# Patient Record
Sex: Male | Born: 1940 | Race: White | Hispanic: No | Marital: Married | Smoking: Never smoker
Health system: Southern US, Community
[De-identification: ages and names within clinical notes are randomized; demographics above are authoritative.]

## PROBLEM LIST (undated history)

## (undated) DIAGNOSIS — K219 Gastro-esophageal reflux disease without esophagitis: Secondary | ICD-10-CM

## (undated) DIAGNOSIS — M1990 Unspecified osteoarthritis, unspecified site: Secondary | ICD-10-CM

## (undated) DIAGNOSIS — G20 Parkinson's disease: Secondary | ICD-10-CM

## (undated) DIAGNOSIS — R030 Elevated blood-pressure reading, without diagnosis of hypertension: Secondary | ICD-10-CM

## (undated) DIAGNOSIS — Z8614 Personal history of Methicillin resistant Staphylococcus aureus infection: Secondary | ICD-10-CM

## (undated) DIAGNOSIS — G20A1 Parkinson's disease without dyskinesia, without mention of fluctuations: Secondary | ICD-10-CM

## (undated) DIAGNOSIS — Z872 Personal history of diseases of the skin and subcutaneous tissue: Secondary | ICD-10-CM

## (undated) DIAGNOSIS — R0609 Other forms of dyspnea: Secondary | ICD-10-CM

## (undated) HISTORY — DX: Parkinson's disease without dyskinesia, without mention of fluctuations: G20.A1

## (undated) HISTORY — DX: Personal history of diseases of the skin and subcutaneous tissue: Z87.2

## (undated) HISTORY — DX: Parkinson's disease: G20

## (undated) HISTORY — PX: COLONOSCOPY: SHX174

---

## 2006-12-26 ENCOUNTER — Ambulatory Visit: Payer: Self-pay | Admitting: Gastroenterology

## 2008-02-23 HISTORY — PX: ANKLE SURGERY: SHX546

## 2008-04-20 ENCOUNTER — Inpatient Hospital Stay: Payer: Self-pay | Admitting: Podiatry

## 2009-05-24 IMAGING — CR DG CHEST 1V PORT
1 series · 1 of 1 positions shown · non-contrast
Comparison: none

REASON FOR EXAM: pre-op
COMMENTS:

PROCEDURE:     DXR - DXR PORTABLE CHEST SINGLE VIEW  - April 20, 2008  [DATE]
RESULT:     The lungs are adequately inflated. There is no focal infiltrate.
The cardiac silhouette is top normal in size. There is mild prominence of
the central pulmonary vascularity. There is no focal pneumonia.

[view not recorded]
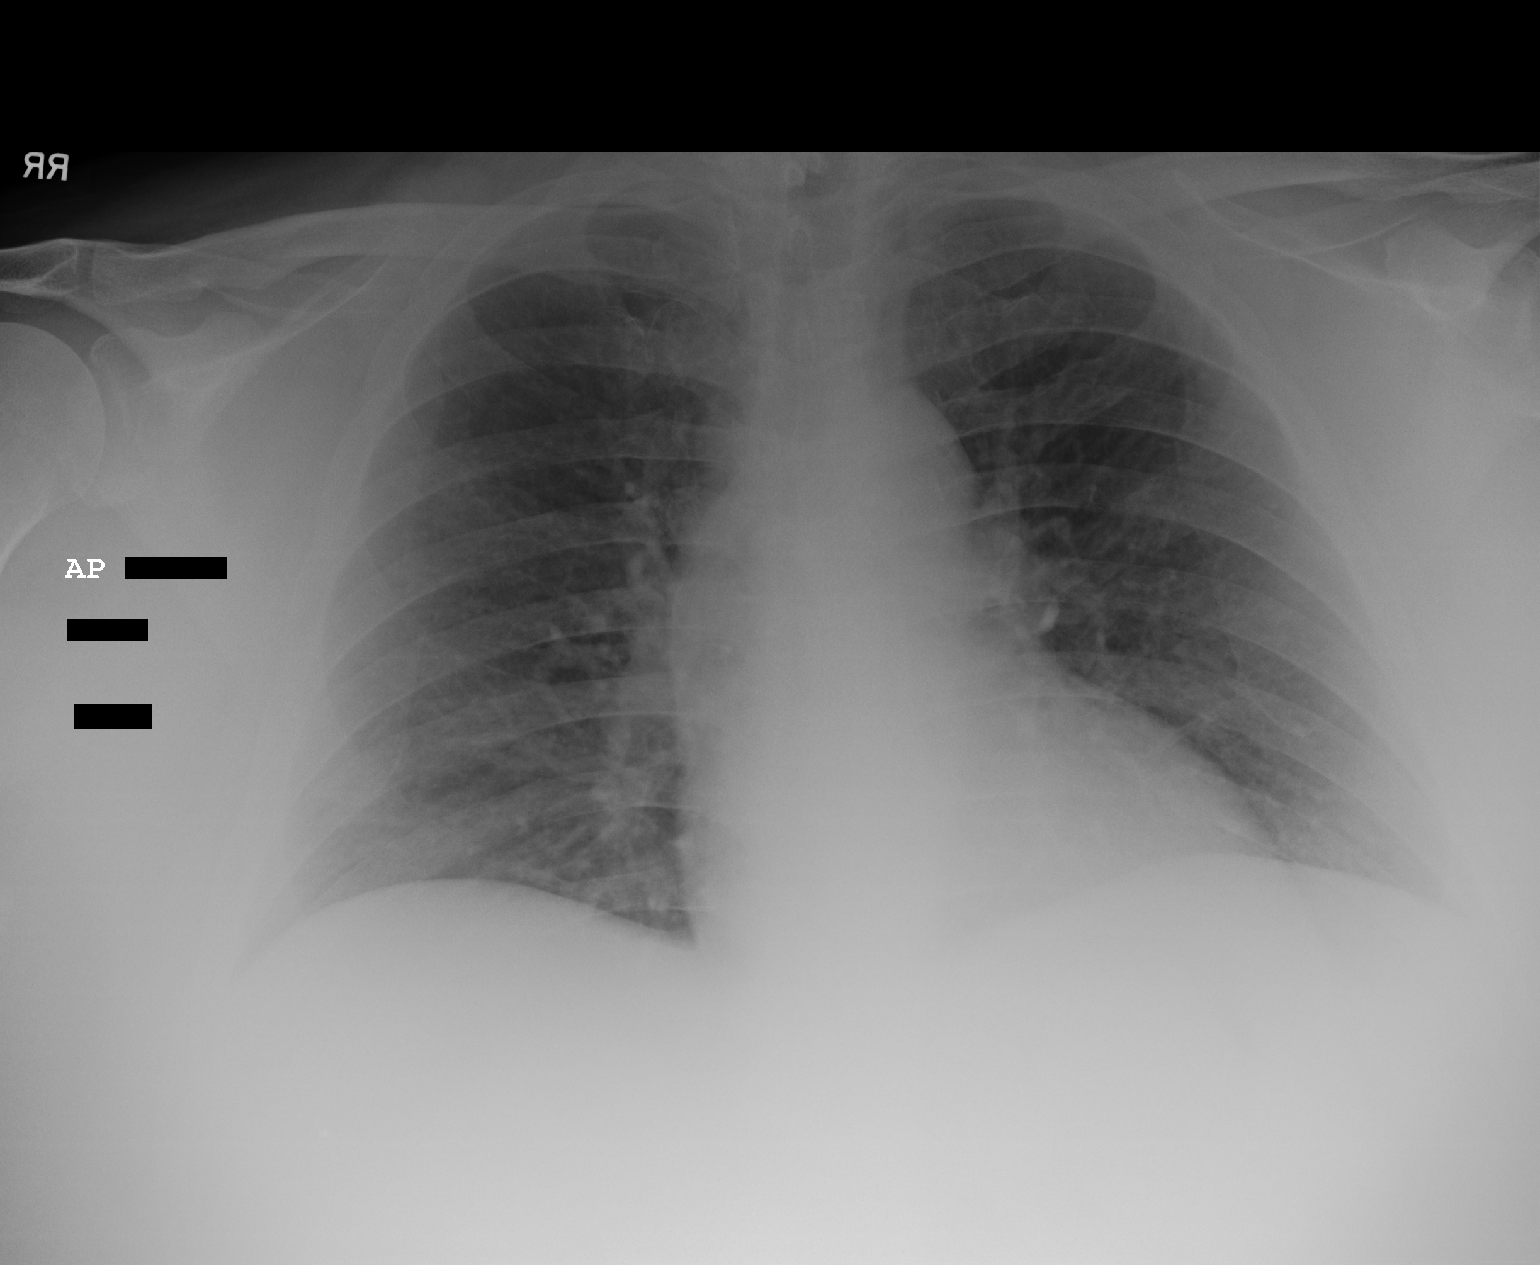

[1 of 1 positions shown; findings below may reference images not displayed]

IMPRESSION: Very mild prominence of the pulmonary vascularity is noted
which may be related more to the portable technique than to true underlying
CHF. Correlation with patient's clinical history is needed. I do not see
evidence of atelectasis or pneumonia.

## 2009-05-24 IMAGING — CR DG ANKLE COMPLETE 3+V*L*
1 series · 5 of 5 positions shown · non-contrast
Comparison: none

REASON FOR EXAM: ankle pain s/p fall
COMMENTS:

[Series 1: view not recorded · 0.17mm/px · 5 of 5 slices shown]
[im 1/5]
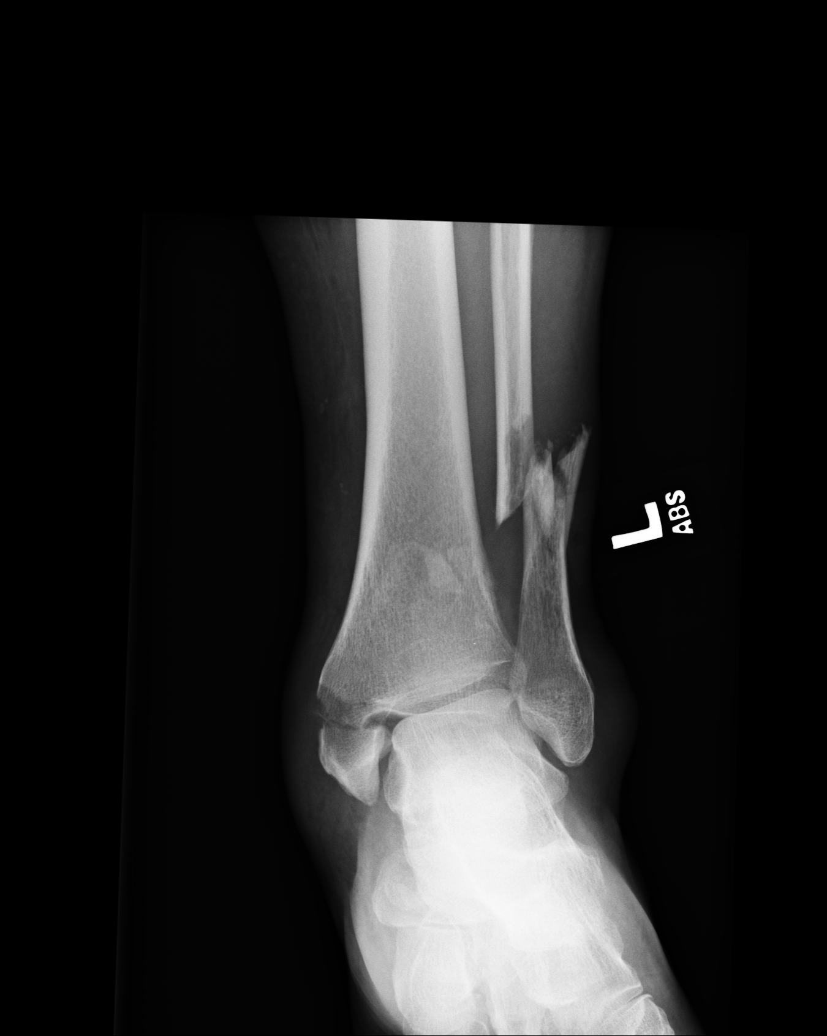
[im 2/5]
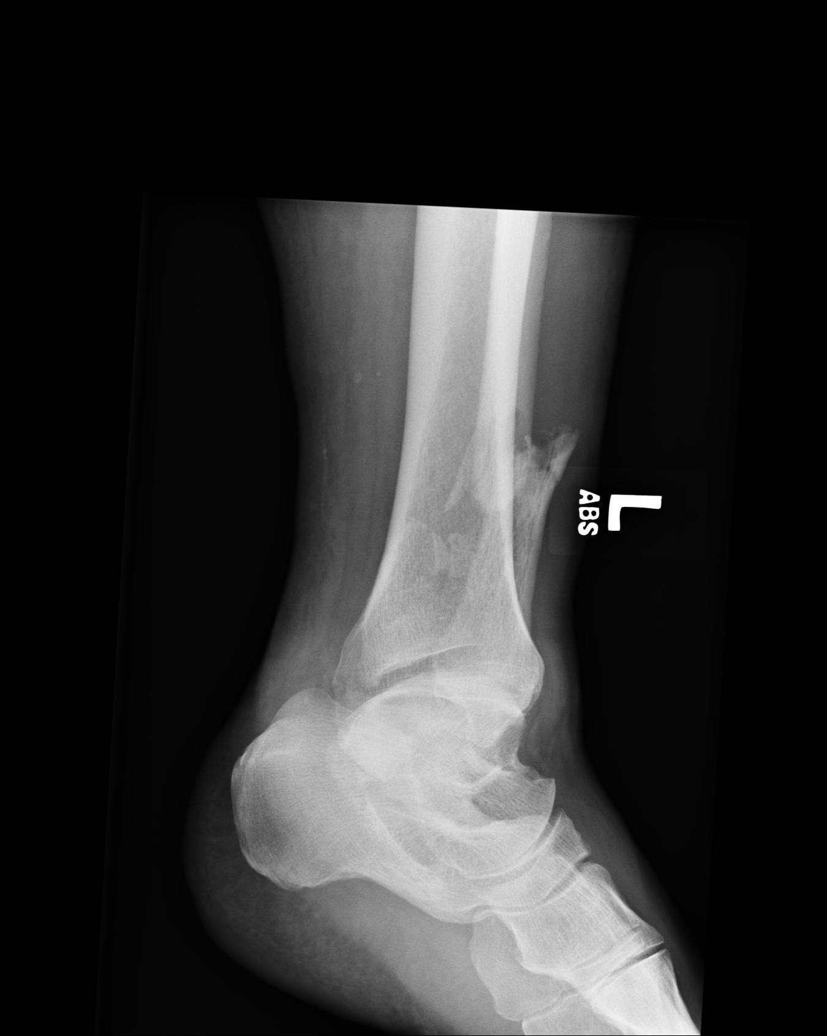
[im 3/5]
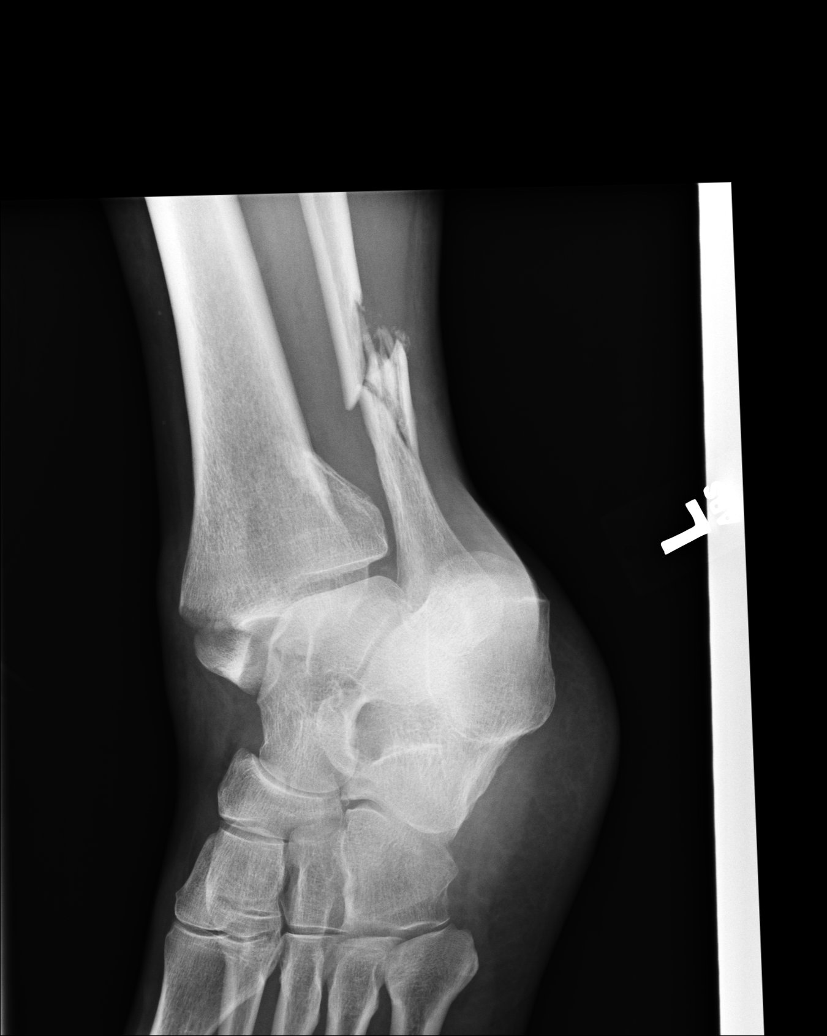
[im 4/5]
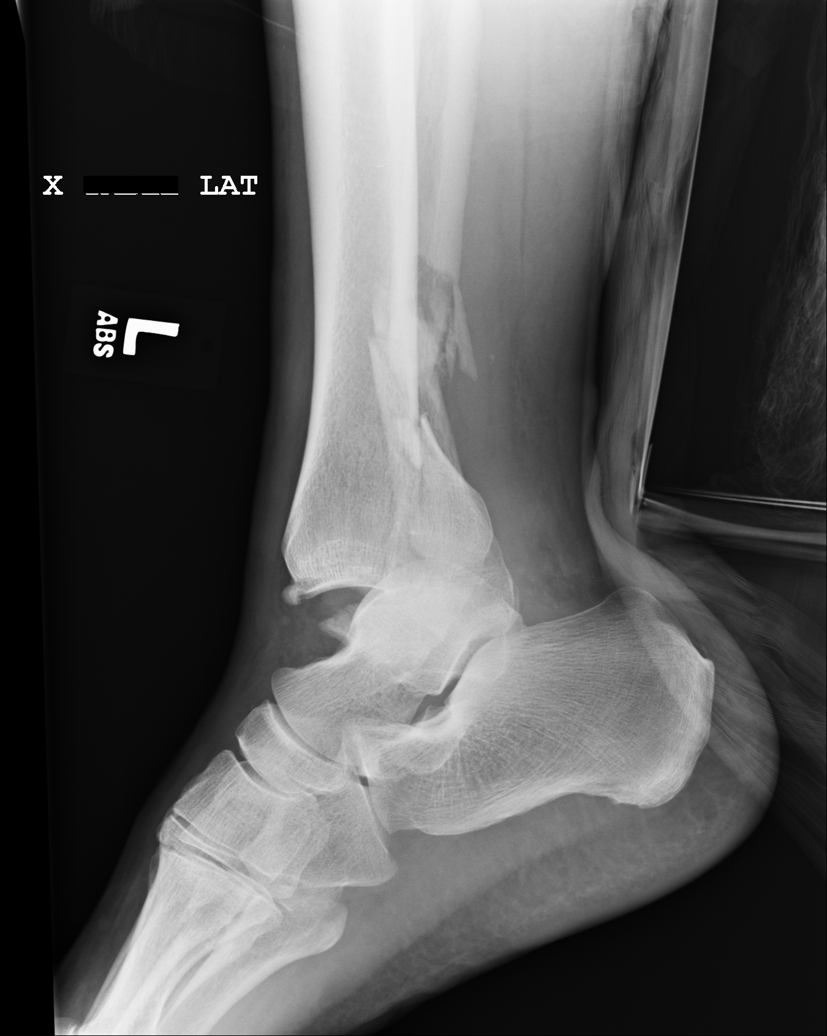
[im 5/5]
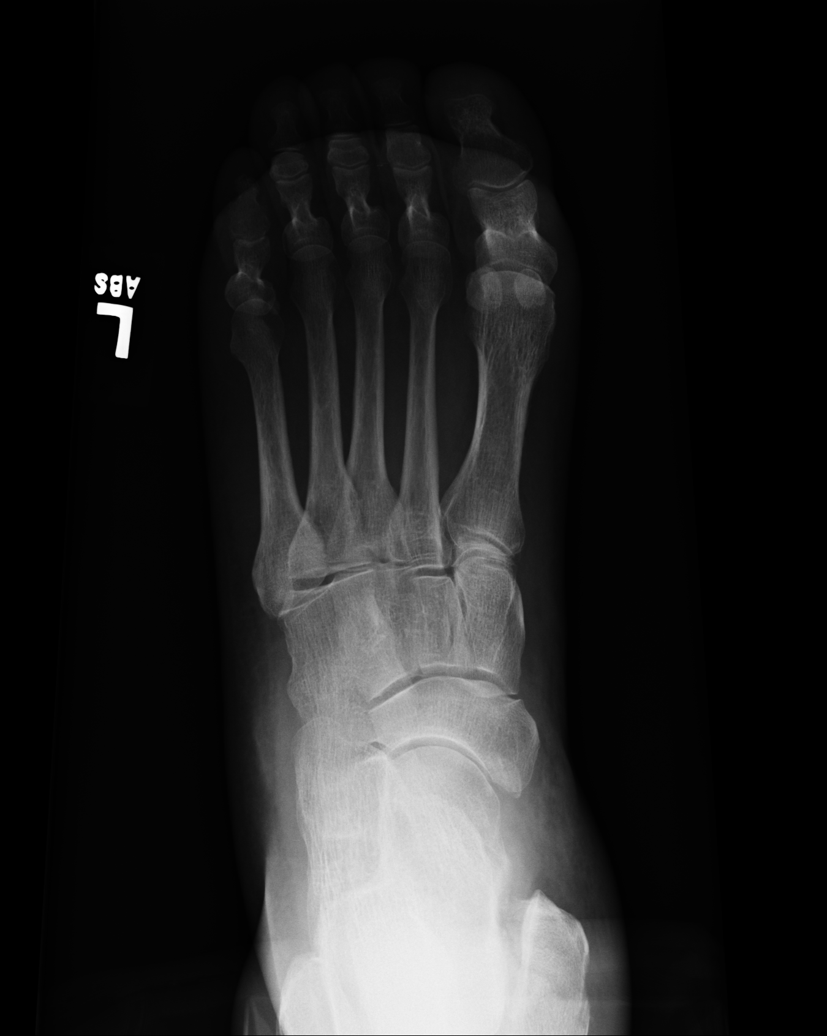

[5 of 5 positions shown; findings below may reference images not displayed]

PROCEDURE:     DXR - DXR ANKLE LEFT COMPLETE  - April 20, 2008  [DATE]

RESULT:     Five views of the ankle are submitted. The patient has sustained
a trimalleolar fracture. The fibular fracture is relatively high involving
the distal diaphysis. It is displaced by one shaft width and angulated and
comminuted. The medial malleolar fracture is horizontally oriented and the
fracture fragments are distracted. The posterior malleolar fracture is
mildly angulated. There is disruption of the ankle joint mortise. As best as
can be determined the talus remains intact. The calcaneus appears intact as
do the remainder of the tarsal bones.
IMPRESSION: The patient has sustained a trimalleolar fracture with the
fibular component being relatively superiorly positioned in the distal
fibular shaft. There is considerable soft tissue swelling diffusely over the
ankle and there is disruption of the ankle joint mortise.

## 2017-12-21 DIAGNOSIS — R030 Elevated blood-pressure reading, without diagnosis of hypertension: Secondary | ICD-10-CM | POA: Insufficient documentation

## 2017-12-21 DIAGNOSIS — G20C Parkinsonism, unspecified: Secondary | ICD-10-CM | POA: Insufficient documentation

## 2018-07-12 DIAGNOSIS — Z872 Personal history of diseases of the skin and subcutaneous tissue: Secondary | ICD-10-CM

## 2019-06-12 ENCOUNTER — Other Ambulatory Visit: Payer: Self-pay

## 2019-06-12 ENCOUNTER — Encounter: Payer: Self-pay | Admitting: General Surgery

## 2019-06-12 ENCOUNTER — Telehealth: Payer: Self-pay | Admitting: General Surgery

## 2019-06-12 ENCOUNTER — Ambulatory Visit: Payer: Medicare Other | Admitting: General Surgery

## 2019-06-12 VITALS — BP 136/88 | HR 58 | Temp 97.2°F | Resp 14 | Ht 70.0 in | Wt 277.0 lb

## 2019-06-12 DIAGNOSIS — K429 Umbilical hernia without obstruction or gangrene: Secondary | ICD-10-CM | POA: Insufficient documentation

## 2019-06-12 NOTE — Progress Notes (Signed)
Patient ID: Troy Gardner, male   DOB: [birth year], 79 y.o.   MRN: [id number]  Chief Complaint  Patient presents with  . New Patient (Initial Visit)    Hernia    HPI Troy Gardner is a 79 y.o. male.  He has been referred for evaluation of an umbilical hernia.  He says that he has noticed a bulge in the area for at least 4 to 5 years, but up until 2 weeks ago it did not bother him.  At that time, it became more uncomfortable, particularly with activities from work.  He says that when he noticed the discomfort, he grabbed a small book and strapped it over the hernia with a belt, essentially making a truss-like support.  He has been wearing a back brace belt ever since and says that this is helpful.  He denies any nausea or vomiting.  No fevers or chills.  No constipation or obstipation.  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   Past Medical History:  Diagnosis Date  . Hx of actinic keratosis   . Parkinson's disease [facility])     Past Surgical History:  Procedure Laterality Date  . ANKLE SURGERY  2010    Family History  Problem Relation Age of Onset  . Skin cancer Father   . Diabetes Mother   . Glaucoma Mother   . Congestive Heart Failure Mother     Social History Social History   Tobacco Use  . Smoking status: Former Smoker    Years: 19.00    Types: Cigarettes    Quit date: 02/23/2000    Years since quitting: 19.3  . Smokeless tobacco: Never Used  Substance Use Topics  . Alcohol use: Never  . Drug use: Not on file    No Known Allergies  Current Outpatient Medications  Medication Sig Dispense Refill  . Multiple Vitamin (MULTIVITAMIN) tablet Take 1 tablet by mouth daily.     No current facility-administered medications for this visit.    Review of Systems Review of Systems  Musculoskeletal: Positive for arthralgias.  Hematological: Bruises/bleeds easily.  All other systems reviewed and are negative.   Blood pressure 136/88, pulse (!)  58, temperature (!) 97.2 F (36.2 C), resp. rate 14, height 5\' 10"  (1.778 m), weight 277 lb (125.6 kg), SpO2 97 %.  Body mass index is 39.75 kg/m.  Physical Exam Physical Exam Constitutional:      General: He is not in acute distress.    Appearance: He is obese.  HENT:     Head: Normocephalic and atraumatic.     Nose:     Comments: Covered with a mask secondary to COVID-19 precautions    Mouth/Throat:     Comments: Covered with a mask secondary to COVID-19 precautions Eyes:     General: No scleral icterus.       Right eye: No discharge.        Left eye: No discharge.  Neck:     Comments: Thyroid is diffusely enlarged without any discrete nodules.  The gland moves freely with deglutition. Cardiovascular:     Rate and Rhythm: Normal rate and regular rhythm.  Pulmonary:     Effort: Pulmonary effort is normal. No respiratory distress.     Breath sounds: Normal breath sounds.  Abdominal:     General: Bowel sounds are normal.     Palpations: Abdomen is soft.     Comments: Protuberant, consistent with his level of obesity.  Diastasis rectus is present.  There is a small umbilical hernia that is easily reducible.  Genitourinary:    Comments: Deferred Musculoskeletal:        General: No swelling.  Lymphadenopathy:     Cervical: No cervical adenopathy.  Skin:    General: Skin is warm and dry.  Neurological:     General: No focal deficit present.     Mental Status: He is alert and oriented to person, place, and time.  Psychiatric:        Mood and Affect: Mood normal.        Behavior: Behavior normal.        Thought Content: Thought content normal.     Data Reviewed I reviewed the printed note sent over by his primary care provider.  This is dated June 01, 2019.  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  This note also indicates that his hemoglobin A1c was 5.4% Additional labs were provided in this document that were essentially  within normal limits including a basic metabolic panel cholesterol and TSH.  Assessment this is a 79 year old man who has a small umbilical hernia.  It has been present for a number of years, but has become increasingly symptomatic with his work activities.  He would like to have it repaired.  Plan I have offered him an open umbilical hernia repair.  I discussed the risks of the operation with him and his daughter who accompanied him today.  These include, but are not limited to, bleeding, infection, hernia recurrence, mesh complications, injury to bowel and need for additional procedures.  I stressed to him that he will need to refrain from any activity that would involve lifting, pushing, or pulling over 10 pounds for total of 6 weeks from the time of his surgery.  He expressed understanding of this recommendation.  We will get him scheduled for surgery at a mutually convenient date.    Fredirick Maudlin 06/12/2019, 10:05 AM

## 2019-06-12 NOTE — Patient Instructions (Signed)
You have requested for your Umbilical Hernia be repaired. This will be scheduled at [facility] with Dr Celine Ahr.  Please see your (blue)pre-care sheet for information. Our surgery scheduler will be in contact with you to look at surgery dates and go over surgery information.   You will need to arrange to be off work for 1-2 weeks but will have to have a lifting restriction of no more than 10 lbs for 6 weeks following your surgery.   Umbilical Hernia, Adult A hernia is a bulge of tissue that pushes through an opening between muscles. An umbilical hernia happens in the abdomen, near the belly button (umbilicus). The hernia may contain tissues from the small intestine, large intestine, or fatty tissue covering the intestines (omentum). Umbilical hernias in adults tend to get worse over time, and they require surgical treatment. There are several types of umbilical hernias. You may have:  A hernia located just above or below the umbilicus (indirect hernia). This is the most common type of umbilical hernia in adults.  A hernia that forms through an opening formed by the umbilicus (direct hernia).  A hernia that comes and goes (reducible hernia). A reducible hernia may be visible only when you strain, lift something heavy, or cough. This type of hernia can be pushed back into the abdomen (reduced).  A hernia that traps abdominal tissue inside the hernia (incarcerated hernia). This type of hernia cannot be reduced.  A hernia that cuts off blood flow to the tissues inside the hernia (strangulated hernia). The tissues can start to die if this happens. This type of hernia requires emergency treatment.  What are the causes? An umbilical hernia happens when tissue inside the abdomen presses on a weak area of the abdominal muscles. What increases the risk? You may have a greater risk of this condition if you:  Are obese.  Have had several pregnancies.  Have a buildup of fluid inside your  abdomen (ascites).  Have had surgery that weakens the abdominal muscles.  What are the signs or symptoms? The main symptom of this condition is a painless bulge at or near the belly button. A reducible hernia may be visible only when you strain, lift something heavy, or cough. Other symptoms may include:  Dull pain.  A feeling of pressure.  Symptoms of a strangulated hernia may include:  Pain that gets increasingly worse.  Nausea and vomiting.  Pain when pressing on the hernia.  Skin over the hernia becoming red or purple.  Constipation.  Blood in the stool.  How is this diagnosed? This condition may be diagnosed based on:  A physical exam. You may be asked to cough or strain while standing. These actions increase the pressure inside your abdomen and force the hernia through the opening in your muscles. Your health care provider may try to reduce the hernia by pressing on it.  Your symptoms and medical history.  How is this treated? Surgery is the only treatment for an umbilical hernia. Surgery for a strangulated hernia is done as soon as possible. If you have a small hernia that is not incarcerated, you may need to lose weight before having surgery. Follow these instructions at home:  Lose weight, if told by your health care provider.  Do not try to push the hernia back in.  Watch your hernia for any changes in color or size. Tell your health care provider if any changes occur.  You may need to avoid activities that increase pressure on your  hernia.  Do not lift anything that is heavier than 10 lb (4.5 kg) until your health care provider says that this is safe.  Take over-the-counter and prescription medicines only as told by your health care provider.  Keep all follow-up visits as told by your health care provider. This is important. Contact a health care provider if:  Your hernia gets larger.  Your hernia becomes painful. Get help right away if:  You develop  sudden, severe pain near the area of your hernia.  You have pain as well as nausea or vomiting.  You have pain and the skin over your hernia changes color.  You develop a fever. This information is not intended to replace advice given to you by your health care provider. Make sure you discuss any questions you have with your health care provider. Document Released: 07/11/2015 Document Revised: 10/12/2015 Document Reviewed: 07/11/2015 Elsevier Interactive Patient Education  [employer].

## 2019-06-12 NOTE — H&P (View-Only) (Signed)
Patient ID: Troy Gardner, male   DOB: Feb 17, 1941, 79 y.o.   MRN: [id number]  Chief Complaint  Patient presents with  . New Patient (Initial Visit)    Hernia    HPI UZOMA Gardner is a 79 y.o. male.  He has been referred for evaluation of an umbilical hernia.  He says that he has noticed a bulge in the area for at least 4 to 5 years, but up until 2 weeks ago it did not bother him.  At that time, it became more uncomfortable, particularly with activities from work.  He says that when he noticed the discomfort, he grabbed a small book and strapped it over the hernia with a belt, essentially making a truss-like support.  He has been wearing a back brace belt ever since and says that this is helpful.  He denies any nausea or vomiting.  No fevers or chills.  No constipation or obstipation.  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   Past Medical History:  Diagnosis Date  . Hx of actinic keratosis   . Parkinson's disease [facility])     Past Surgical History:  Procedure Laterality Date  . ANKLE SURGERY  2010    Family History  Problem Relation Age of Onset  . Skin cancer Father   . Diabetes Mother   . Glaucoma Mother   . Congestive Heart Failure Mother     Social History Social History   Tobacco Use  . Smoking status: Former Smoker    Years: 19.00    Types: Cigarettes    Quit date: 02/23/2000    Years since quitting: 19.3  . Smokeless tobacco: Never Used  Substance Use Topics  . Alcohol use: Never  . Drug use: Not on file    No Known Allergies  Current Outpatient Medications  Medication Sig Dispense Refill  . Multiple Vitamin (MULTIVITAMIN) tablet Take 1 tablet by mouth daily.     No current facility-administered medications for this visit.    Review of Systems Review of Systems  Musculoskeletal: Positive for arthralgias.  Hematological: Bruises/bleeds easily.  All other systems reviewed and are negative.   Blood pressure 136/88, pulse (!)  58, temperature (!) 97.2 F (36.2 C), resp. rate 14, height 5\' 10"  (1.778 m), weight 277 lb (125.6 kg), SpO2 97 %.  Body mass index is 39.75 kg/m.  Physical Exam Physical Exam Constitutional:      General: He is not in acute distress.    Appearance: He is obese.  HENT:     Head: Normocephalic and atraumatic.     Nose:     Comments: Covered with a mask secondary to COVID-19 precautions    Mouth/Throat:     Comments: Covered with a mask secondary to COVID-19 precautions Eyes:     General: No scleral icterus.       Right eye: No discharge.        Left eye: No discharge.  Neck:     Comments: Thyroid is diffusely enlarged without any discrete nodules.  The gland moves freely with deglutition. Cardiovascular:     Rate and Rhythm: Normal rate and regular rhythm.  Pulmonary:     Effort: Pulmonary effort is normal. No respiratory distress.     Breath sounds: Normal breath sounds.  Abdominal:     General: Bowel sounds are normal.     Palpations: Abdomen is soft.     Comments: Protuberant, consistent with his level of obesity.  Diastasis rectus is present.  There is a small umbilical hernia that is easily reducible.  Genitourinary:    Comments: Deferred Musculoskeletal:        General: No swelling.  Lymphadenopathy:     Cervical: No cervical adenopathy.  Skin:    General: Skin is warm and dry.  Neurological:     General: No focal deficit present.     Mental Status: He is alert and oriented to person, place, and time.  Psychiatric:        Mood and Affect: Mood normal.        Behavior: Behavior normal.        Thought Content: Thought content normal.     Data Reviewed I reviewed the printed note sent over by his primary care provider.  This is dated June 01, 2019.  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  This note also indicates that his hemoglobin A1c was 5.4% Additional labs were provided in this document that were essentially  within normal limits including a basic metabolic panel cholesterol and TSH.  Assessment this is a 79 year old man who has a small umbilical hernia.  It has been present for a number of years, but has become increasingly symptomatic with his work activities.  He would like to have it repaired.  Plan I have offered him an open umbilical hernia repair.  I discussed the risks of the operation with him and his daughter who accompanied him today.  These include, but are not limited to, bleeding, infection, hernia recurrence, mesh complications, injury to bowel and need for additional procedures.  I stressed to him that he will need to refrain from any activity that would involve lifting, pushing, or pulling over 10 pounds for total of 6 weeks from the time of his surgery.  He expressed understanding of this recommendation.  We will get him scheduled for surgery at a mutually convenient date.    Troy Gardner 06/12/2019, 10:05 AM

## 2019-06-12 NOTE — Telephone Encounter (Signed)
Pt has been advised of Pre-Admission date/time, COVID Testing date and Surgery date.  Surgery Date: 06/20/19 Preadmission Testing Date: 06/14/19 (phone 8a-1p) Covid Testing Date: 06/18/19 - patient advised to go to the [place] [facility] between 8a-1p   Patient has been made aware to call [phone], between 1-3:00pm the day before surgery, to find out what time to arrive for surgery.

## 2019-06-13 ENCOUNTER — Other Ambulatory Visit: Payer: Self-pay | Admitting: General Surgery

## 2019-06-13 DIAGNOSIS — K429 Umbilical hernia without obstruction or gangrene: Secondary | ICD-10-CM

## 2019-06-14 ENCOUNTER — Other Ambulatory Visit: Payer: Self-pay

## 2019-06-14 ENCOUNTER — Encounter
Admission: RE | Admit: 2019-06-14 | Discharge: 2019-06-14 | Disposition: A | Discharge status: Home / Self Care | Payer: Medicare Other | Source: Ambulatory Visit | Attending: General Surgery | Admitting: General Surgery

## 2019-06-14 DIAGNOSIS — Z01810 Encounter for preprocedural cardiovascular examination: Secondary | ICD-10-CM | POA: Diagnosis present

## 2019-06-14 DIAGNOSIS — I440 Atrioventricular block, first degree: Secondary | ICD-10-CM | POA: Diagnosis not present

## 2019-06-14 HISTORY — DX: Gastro-esophageal reflux disease without esophagitis: K21.9

## 2019-06-14 HISTORY — DX: Personal history of Methicillin resistant Staphylococcus aureus infection: Z86.14

## 2019-06-14 NOTE — Pre-Procedure Instructions (Signed)
ECG 12-lead10/30/2019 [place] Component Name Value Ref Range  Vent Rate (bpm) 60   PR Interval (msec) 202   QRS Interval (msec) 80   QT Interval (msec) 380   QTc (msec) 380   Other Result Information  This result has an attachment that is not available.  Result Narrative  Normal sinus rhythm Low voltage QRS Cannot rule out Anterior infarct , age undetermined Abnormal ECG No previous ECGs available I reviewed and concur with this report. Electronically signed [id number], MD, Cristie Hem [id number]) on 02/02/2018 2:59:13 PM  Status Results Details   Encounter Summary

## 2019-06-14 NOTE — Patient Instructions (Signed)
Your procedure is scheduled on: 06-20-19 [facility] Report to Same Day Surgery 2nd floor medical mall [facility] Entrance-take elevator on left to 2nd floor.  Check in with surgery information desk.) To find out your arrival time please call [phone] between 1PM - 3PM on 06-19-19 TUESDAY  Remember: Instructions that are not followed completely may result in serious medical risk, up to and including death, or upon the discretion of your surgeon and anesthesiologist your surgery may need to be rescheduled.    _x___ 1. Do not eat food after midnight the night before your procedure. NO GUM OR CANDY AFTER MIDNIGHT. You may drink clear liquids up to 2 hours before you are scheduled to arrive at the hospital for your procedure.  Do not drink clear liquids within 2 hours of your scheduled arrival to the hospital.  Clear liquids include  --Water or Apple juice without pulp  --Gatorade  --Black Coffee or Clear Tea (No milk, no creamers, do not add anything to the coffee or Tea   ____Ensure clear carbohydrate drink on the way to the hospital for bariatric patients  ____Ensure clear carbohydrate drink 3 hours before surgery.    __x__ 2. No Alcohol for 24 hours before or after surgery.   __x__3. No Smoking or e-cigarettes for 24 prior to surgery.  Do not use any chewable tobacco products for at least 6 hour prior to surgery   ____  4. Bring all medications with you on the day of surgery if instructed.    __x__ 5. Notify your doctor if there is any change in your medical condition     (cold, fever, infections).    x___6. On the morning of surgery brush your teeth with toothpaste and water.  You may rinse your mouth with mouth wash if you wish.  Do not swallow any toothpaste or mouthwash.   Do not wear jewelry, make-up, hairpins, clips or nail polish.  Do not wear lotions, powders, or perfumes.  Do not shave 48 hours prior to surgery. Men may shave face and neck.  Do not bring valuables to  the hospital.    [facility] is not responsible for any belongings or valuables.               Contacts, dentures or bridgework may not be worn into surgery.  Leave your suitcase in the car. After surgery it may be brought to your room.  For patients admitted to the hospital, discharge time is determined by your treatment team.  _  Patients discharged the day of surgery will not be allowed to drive home.  You will need someone to drive you home and stay with you the night of your procedure.    Please read over the following fact sheets that you were given:   [facility] Preparing for Surgery   ____ Take anti-hypertensive listed below, cardiac, seizure, asthma, anti-reflux and psychiatric medicines. These include:  1. NONE  2.  3.  4.  5.  6.  ____Fleets enema or Magnesium Citrate as directed.   _x___ Use CHG Soap or sage wipes as directed on instruction sheet   ____ Use inhalers on the day of surgery and bring to hospital day of surgery  ____ Stop Metformin and Janumet 2 days prior to surgery.    ____ Take 1/2 of usual insulin dose the night before surgery and none on the morning surgery.   ____ Follow recommendations from Cardiologist, Pulmonologist or PCP regarding stopping Aspirin, Coumadin, Plavix ,Eliquis,  Effient, or Pradaxa, and Pletal.  X____Stop Anti-inflammatories such as Advil, Aleve, Ibuprofen, Motrin, Naproxen, Naprosyn, Goodies powders or aspirin products NOW-OK to take Tylenol    _x___ Stop supplements until after surgery-STOP YOUR Thayne   ____ Bring C-Pap to the hospital.

## 2019-06-14 NOTE — Pre-Procedure Instructions (Signed)
Progress Notes - documented in this encounter Troy Gardner, [name] - 01/16/2019 9:00 AM EST Formatting of this note is different from the original. Established Patient Visit   Chief Complaint: Chief Complaint  Patient presents with  . Follow-up  56mo  Date of Service: 01/16/2019 Date of Birth: 03/19/1940 PCP: Salley Scarlet, MD  History of Present Illness: [name] is a 79 y.o.male patient who returns today for follow-up, and reports doing well. He was originally referred by his primary care provider for evaluation of abnormal routine ECG. The patient has a history of borderline hypertension and prediabetes, and is not currently taking any medications. 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 Repeat ECG here revealed normal sinus rhythm with possible prior anterior infarct. 2D echocardiogram revealed normal left ventricular function, with LVEF greater than 55% with mild tricuspid regurgitation. The patient denies chest pain. He has mild exertional shortness of breath, which is unchanged. 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 He denies palpitations or heart racing. He denies presyncope or syncope. The patient is somewhat active with his job, but does not do additional structured exercise.  The patient has a history of elevated blood pressure without diagnosis of hypertension, with blood pressure well controlled today on diet therapy. The patient does not closely follow a low sodium, no added salt diet, and often eats while out on the road. He states that typically blood pressure recordings are 110-120/60-70.  Past Medical and Surgical History  Past Medical History No past medical history on file.  Past Surgical History He has no past surgical history on file.   Medications and Allergies  Current Medications  No current outpatient medications on file.   No current facility-administered medications for this visit.   Allergies: Patient has no known allergies.  Social and Family History  Social History reports that he has never smoked. He has quit using smokeless tobacco. His smokeless tobacco use included chew. He reports that he does not drink alcohol or use drugs.  Family History Family History  Problem Relation Age of Onset  . Diabetes Father  . Heart failure Father  . Coronary Artery Disease (Blocked arteries around heart) Father   Review of Systems   Review of Systems: The patient denies chest pain, with mild exertional shortness of breath, without orthopnea, paroxysmal nocturnal dyspnea, with mild intermittent pedal edema, without palpitations, heart racing, presyncope, syncope. Review of 10 Systems is negative except as described above.  Physical Examination   Vitals:BP 128/80  Pulse 65  Ht 177.8 cm (5\' 10" )  Wt (!) 128.6 kg (283 lb 9.6 oz)  SpO2 97%  BMI 40.69 kg/m  Ht:177.8 cm (5\' 10" ) Wt:(!) 128.6 kg (283 lb 9.6 oz) [id number] surface area is 2.52 meters squared. Body mass index is 40.69 kg/m.  General: Alert and oriented. Well-appearing. No acute distress. HEENT: Pupils equally reactive to light and accomodation  Neck: Supple, no JVD Lungs: Normal effort of breathing; clear to auscultation bilaterally; no wheezes, rales, rhonchi Heart: Regular rate and rhythm. No murmur, rub, or gallop Abdomen: Obese. nondistended, with normal bowel sounds Extremities: no cyanosis, clubbing, or edema Peripheral Pulses: 2+ radial  Skin: Warm, dry, no diaphoresis  Assessment   79 y.o. male with  1. Borderline hypertension  2. Need for immunization against influenza  3. Abnormal ECG  4. Exertional dyspnea   79 year old obese gentleman originally referred for  evaluation of borderline abnormal ECG, with chronic exertional dyspnea, without chest pain. The patient has had elevated blood pressure at times without a diagnosis of  hypertension, and is prediabetic. His blood pressure today is well controlled on diet therapy. 2D echocardiogram revealed normal left ventricular function, with LVEF greater than 55% with mild tricuspid regurgitation.  Plan   1. Counseled patient about heart healthy diet 2. No further cardiac diagnostics recommended at this time 3. Return precautions given to the patient 4. Encouraged the patient to be more active, exercise regularly, and lose weight. 5. Return to clinic for follow-up in 1 year  No orders of the defined types were placed in this encounter.  Return in about 1 year (around 01/16/2020). I personally performed the service, non-incident to. [facility])  ANNA Shirlee More, PA-C  Electronically signed by Troy Gardner, [place] at 01/21/2019 5:13 PM EST   Plan of Treatment - documented as of this encounter Not on file

## 2019-06-14 NOTE — Pre-Procedure Instructions (Signed)
Echo complete11/15/2019 [place] Component Name Value Ref Range  LV Ejection Fraction (%) 55   Aortic Valve Regurgitation Grade none   Aortic Valve Stenosis Grade none   Aortic Valve Max Velocity (m/s) 1.4 m/sec  Aortic Valve Stenosis Mean Gradient (mmHg) 4.2 mmHg  Mitral Valve Regurgitation Grade trivial   Mitral Valve Stenosis Grade none   Tricuspid Valve Regurgitation Grade mild   Tricuspid Valve Regurgitation Max Velocity (m/s) 2.8 m/sec  Right Ventricle Systolic Pressure (mmHg) 123XX123 mmHg  LV End Diastolic Diameter (cm) 5 cm  LV End Systolic Diameter (cm) 3.5 cm  LV Septum Wall Thickness (cm) 1.2 cm  LV Posterior Wall Thickness (cm) 1.1 cm  Left Atrium Diameter (cm) 4.1 cm  Result Narrative              CARDIOLOGY DEPARTMENT        [place], [place]                  [id number]      [facility]              Acct #: [id number]      [place], [place], [place] [address]    Date: 01/04/2018 02: 25 PM                                Adult  Male  Age: 79 yrs      ECHOCARDIOGRAM REPORT               Outpatient                                KC^^KCWC    STUDY:CHEST WALL        TAPE:0000: 00: 0: 00: 00 MD1: [facility], [facility]    ECHO:Yes  DOPPLER:Yes    FILE:0000-000-000    BP: 128/72 mmHg    COLOR:Yes  CONTRAST:No   MACHINE:Philips  RV BIOPSY:No     3D:No SOUND QLTY:Moderate      Height: 70 in   MEDIUM:None                       Weight: 284 lb                                BSA: 2.4 m2 _________________________________________________________________________________________        HISTORY: DOE         REASON: Assess, LV function       INDICATION: R06.02 Shortness of  breath _________________________________________________________________________________________ ECHOCARDIOGRAPHIC MEASUREMENTS 2D DIMENSIONS AORTA         Values  Normal Range  MAIN PA     Values  Normal Range        Annulus: nm*     [2.3-2.9]     PA Main: nm*    [1.5-2.1]       Aorta Sin: 3.8 cm    [3.1-3.7]  RIGHT VENTRICLE      ST Junction: nm*     [2.6-3.2]     RV Base: 3.8 cm  [<4.2]       Asc.Aorta: nm*     [2.6-3.4]     RV Mid: nm*    [<3.5] LEFT VENTRICLE  RV Length: nm*    [<8.6]         LVIDd: 5.0 cm    [4.2-5.9]  INFERIOR VENA CAVA         LVIDs: 3.5 cm            Max. IVC: nm*    [<=2.1]           FS: 29.7 %    [>25]      Min. IVC: nm*          SWT: 1.2 cm    [0.6-1.0]  ------------------          PWT: 1.1 cm    [0.6-1.0]  nm* - not measured LEFT ATRIUM        LA Diam: 4.1 cm    [3.0-4.0]      LA A4C Area: nm*     [<20]       LA Volume: nm*     [18-58] _________________________________________________________________________________________ ECHOCARDIOGRAPHIC DESCRIPTIONS AORTIC ROOT          Size: Normal       Dissection: INDETERM FOR DISSECTION AORTIC VALVE        Leaflets: Tricuspid          Morphology: Normal        Mobility: Fully mobile LEFT VENTRICLE          Size: Normal            Anterior: Normal      Contraction: Normal             Lateral: Normal       Closest EF: >55% (Estimated)        Septal: Normal       LV Masses: No Masses            Apical: Normal          LVH: None             Inferior: Normal                           Posterior: Normal      Dias.FxClass: (Grade 1)  relaxation abnormal, E/A reversal MITRAL VALVE        Leaflets: Normal            Mobility: Fully mobile       Morphology: Normal LEFT ATRIUM          Size: Normal            LA Masses: No masses       IA Septum: Normal IAS MAIN PA          Size: Normal PULMONIC VALVE       Morphology: Normal            Mobility: Fully mobile RIGHT VENTRICLE       RV Masses: No Masses             Size: Normal       Free Wall: Normal           Contraction: Normal TRICUSPID VALVE        Leaflets: Normal            Mobility: Fully mobile       Morphology: Normal RIGHT ATRIUM          Size: Normal            RA Other: None        RA  Mass: No masses PERICARDIUM         Fluid: No effusion INFERIOR VENACAVA          Size: Normal Normal respiratory collapse _________________________________________________________________________________________  DOPPLER ECHO and OTHER SPECIAL PROCEDURES         Aortic: No AR           No AS             139.5 cm/sec peak vel   7.8 mmHg peak grad             4.2 mmHg mean grad     3.4 cm^2 by DOPPLER         Mitral: TRIVIAL MR         No MS             MV Inflow E Vel = 86.8 cm/sec   MV Annulus E'Vel = 7.5 cm/sec             E/E'Ratio = 11.6       Tricuspid: MILD TR          No TS             281.0 cm/sec peak TR vel  36.6 mmHg peak RV pressure       Pulmonary: TRIVIAL PR         No PS _________________________________________________________________________________________ INTERPRETATION NORMAL LEFT VENTRICULAR SYSTOLIC FUNCTION WITH AN ESTIMATED EF = >55 % NORMAL RIGHT VENTRICULAR SYSTOLIC FUNCTION MILD TRICUSPID VALVE INSUFFICIENCY TRACE MITRAL VALVE INSUFFICIENCY NO  VALVULAR STENOSIS _________________________________________________________________________________________ Electronically signed by   MD Troy Gardner on 01/06/2018 01: 05 PM      Performed By: Troy Gardner, RDCS, RVT   Ordering Physician: Troy Gardner _________________________________________________________________________________________  Other Result Information  Interface, Text Results In - 01/06/2018  1:05 PM EST Formatting of this note might be different from the original.                         Troy Gardner, Troy Gardner                                    [id number]           [place] #: [id number]           [address] [place], [place] [address]       Date: 01/04/2018 02: 21 PM                                                              Adult   Male   Age: 79 yrs           ECHOCARDIOGRAM REPORT                              Outpatient  KC^^KCWC      STUDY:CHEST WALL               TAPE:0000: 00: 0: 00: 00 MD1: CLINIC, [facility]       ECHO:Yes    DOPPLER:Yes       FILE:0000-000-000        BP: 128/72 mmHg      COLOR:Yes   CONTRAST:No     MACHINE:Philips  RV BIOPSY:No          3D:No  SOUND QLTY:Moderate            Height: 70 in     MEDIUM:None                                              Weight: 284 lb                                                              BSA: 2.4 m2 _________________________________________________________________________________________               HISTORY: DOE                REASON: Assess, LV function            INDICATION: R06.02 Shortness of breath _________________________________________________________________________________________ ECHOCARDIOGRAPHIC MEASUREMENTS 2D DIMENSIONS AORTA                  Values   Normal Range   MAIN PA         Values    Normal Range                Annulus: nm*          [2.3-2.9]         PA Main: nm*       [1.5-2.1]             Aorta Sin: 3.8 cm       [3.1-3.7]    RIGHT VENTRICLE           ST Junction: nm*          [2.6-3.2]         RV Base: 3.8 cm    [<4.2]             Asc.Aorta: nm*          [2.6-3.4]          RV Mid: nm*       [<3.5] LEFT VENTRICLE                                      RV Length: nm*       [<8.6]                 LVIDd: 5.0 cm       [4.2-5.9]    INFERIOR VENA CAVA                 LVIDs: 3.5 cm                        Max. IVC: nm*       [<=  2.1]                    FS: 29.7 %       [>25]            Min. IVC: nm*                   SWT: 1.2 cm       [0.6-1.0]    ------------------                   PWT: 1.1 cm       [0.6-1.0]    nm* - not measured LEFT ATRIUM               LA Diam: 4.1 cm       [3.0-4.0]           LA A4C Area: nm*          [<20]             LA Volume: nm*          [18-58] _________________________________________________________________________________________ ECHOCARDIOGRAPHIC DESCRIPTIONS AORTIC ROOT                  Size: Normal            Dissection: INDETERM FOR DISSECTION AORTIC VALVE              Leaflets: Tricuspid                   Morphology: Normal              Mobility: Fully mobile LEFT VENTRICLE                  Size: Normal                        Anterior: Normal           Contraction: Normal                         Lateral: Normal            Closest EF: >55% (Estimated)                Septal: Normal             LV Masses: No Masses                       Apical: Normal                   LVH: None                          Inferior: Normal                                                     Posterior: Normal          Dias.FxClass: (Grade 1) relaxation abnormal, E/A reversal MITRAL VALVE              Leaflets: Normal                        Mobility: Fully mobile  Morphology: Normal LEFT ATRIUM                  Size: Normal                       LA Masses: No masses              IA Septum: Normal IAS MAIN PA                  Size: Normal PULMONIC VALVE            Morphology: Normal                        Mobility: Fully mobile RIGHT VENTRICLE             RV Masses: No Masses                         Size: Normal             Free Wall: Normal                     Contraction: Normal TRICUSPID VALVE              Leaflets: Normal                        Mobility: Fully mobile            Morphology: Normal RIGHT ATRIUM                  Size: Normal                        RA Other: None               RA Mass: No masses PERICARDIUM                 Fluid: No effusion INFERIOR VENACAVA                  Size: Normal Normal respiratory collapse _________________________________________________________________________________________  DOPPLER ECHO and OTHER SPECIAL PROCEDURES                Aortic: No AR                      No AS                        139.5 cm/sec peak vel      7.8 mmHg peak grad                        4.2 mmHg mean grad         3.4 cm^2 by DOPPLER                Mitral: TRIVIAL MR                 No MS                        MV Inflow E Vel = 86.8 cm/sec     MV Annulus E'Vel = 7.5 cm/sec                        E/E'Ratio = 11.6  Tricuspid: MILD TR                    No TS                        281.0 cm/sec peak TR vel   36.6 mmHg peak RV pressure             Pulmonary: TRIVIAL PR                 No PS _________________________________________________________________________________________ INTERPRETATION NORMAL LEFT VENTRICULAR SYSTOLIC FUNCTION WITH AN ESTIMATED EF = >55 % NORMAL RIGHT VENTRICULAR SYSTOLIC FUNCTION MILD TRICUSPID VALVE INSUFFICIENCY TRACE MITRAL VALVE INSUFFICIENCY NO VALVULAR STENOSIS _________________________________________________________________________________________ Electronically signed by      MD Troy Gardner on 01/06/2018 01: 05 PM          Performed By: Troy Gardner, RDCS, RVT    Ordering  Physician: Troy Gardner _________________________________________________________________________________________  Status Results Details   Encounter Summary

## 2019-06-15 ENCOUNTER — Encounter
Admission: RE | Admit: 2019-06-15 | Discharge: 2019-06-15 | Disposition: A | Discharge status: Home / Self Care | Payer: Medicare Other | Source: Ambulatory Visit | Attending: General Surgery | Admitting: General Surgery

## 2019-06-15 DIAGNOSIS — Z01810 Encounter for preprocedural cardiovascular examination: Secondary | ICD-10-CM | POA: Diagnosis not present

## 2019-06-18 ENCOUNTER — Other Ambulatory Visit
Admission: RE | Admit: 2019-06-18 | Discharge: 2019-06-18 | Disposition: A | Discharge status: Home / Self Care | Payer: Medicare Other | Source: Ambulatory Visit | Attending: General Surgery | Admitting: General Surgery

## 2019-06-18 ENCOUNTER — Other Ambulatory Visit: Payer: Self-pay

## 2019-06-18 DIAGNOSIS — Z01812 Encounter for preprocedural laboratory examination: Secondary | ICD-10-CM | POA: Insufficient documentation

## 2019-06-18 DIAGNOSIS — Z20822 Contact with and (suspected) exposure to covid-19: Secondary | ICD-10-CM | POA: Insufficient documentation

## 2019-06-18 LAB — SARS CORONAVIRUS 2 (TAT 6-24 HRS): SARS Coronavirus 2: NEGATIVE

## 2019-06-19 MED ORDER — DEXTROSE 5 % IV SOLN
3.0000 g | INTRAVENOUS | Status: AC
  Administered 2019-06-20: 10:00:00 3 g via INTRAVENOUS
  Filled 2019-06-19: qty 3

## 2019-06-20 ENCOUNTER — Ambulatory Visit
Admission: RE | Admit: 2019-06-20 | Discharge: 2019-06-20 | Disposition: A | Discharge status: Home / Self Care | Payer: Medicare Other | Attending: General Surgery | Admitting: General Surgery

## 2019-06-20 ENCOUNTER — Encounter
Admission: RE | Disposition: A | Discharge status: Home / Self Care | Payer: Self-pay | Source: Home / Self Care | Attending: General Surgery

## 2019-06-20 ENCOUNTER — Encounter: Payer: Self-pay | Admitting: General Surgery

## 2019-06-20 ENCOUNTER — Ambulatory Visit: Payer: Medicare Other | Admitting: Certified Registered Nurse Anesthetist

## 2019-06-20 ENCOUNTER — Other Ambulatory Visit: Payer: Self-pay

## 2019-06-20 DIAGNOSIS — K429 Umbilical hernia without obstruction or gangrene: Secondary | ICD-10-CM

## 2019-06-20 DIAGNOSIS — E669 Obesity, unspecified: Secondary | ICD-10-CM | POA: Insufficient documentation

## 2019-06-20 DIAGNOSIS — Z6839 Body mass index (BMI) 39.0-39.9, adult: Secondary | ICD-10-CM | POA: Insufficient documentation

## 2019-06-20 DIAGNOSIS — Z87891 Personal history of nicotine dependence: Secondary | ICD-10-CM | POA: Diagnosis not present

## 2019-06-20 DIAGNOSIS — G20 Parkinson's disease: Secondary | ICD-10-CM | POA: Insufficient documentation

## 2019-06-20 HISTORY — PX: UMBILICAL HERNIA REPAIR: SHX196

## 2019-06-20 SURGERY — REPAIR, HERNIA, UMBILICAL, ADULT
Anesthesia: General | Site: Abdomen

## 2019-06-20 MED ORDER — LIDOCAINE HCL (CARDIAC) PF 100 MG/5ML IV SOSY
PREFILLED_SYRINGE | INTRAVENOUS | Status: DC | PRN
  Administered 2019-06-20: 100 mg via INTRAVENOUS

## 2019-06-20 MED ORDER — BUPIVACAINE LIPOSOME 1.3 % IJ SUSP
INTRAMUSCULAR | Status: DC | PRN
  Administered 2019-06-20: 20 mL

## 2019-06-20 MED ORDER — FAMOTIDINE 20 MG PO TABS
20.0000 mg | ORAL_TABLET | Freq: Once | ORAL | Status: AC
  Administered 2019-06-20: 20 mg via ORAL

## 2019-06-20 MED ORDER — CELECOXIB 200 MG PO CAPS
200.0000 mg | ORAL_CAPSULE | ORAL | Status: AC
  Administered 2019-06-20: 200 mg via ORAL

## 2019-06-20 MED ORDER — HYDROCODONE-ACETAMINOPHEN 5-325 MG PO TABS: 1.0000 | ORAL_TABLET | Freq: Four times a day (QID) | ORAL | 0 refills | Status: AC | PRN

## 2019-06-20 MED ORDER — LIDOCAINE-EPINEPHRINE 1 %-1:100000 IJ SOLN
INTRAMUSCULAR | Status: DC | PRN
  Administered 2019-06-20: 10 mL via INTRAMUSCULAR

## 2019-06-20 MED ORDER — SUGAMMADEX SODIUM 500 MG/5ML IV SOLN
INTRAVENOUS | Status: DC | PRN
  Administered 2019-06-20: 400 mg via INTRAVENOUS

## 2019-06-20 MED ORDER — BUPIVACAINE LIPOSOME 1.3 % IJ SUSP: 20.0000 mL | Freq: Once | INTRAMUSCULAR | Status: DC

## 2019-06-20 MED ORDER — ROCURONIUM BROMIDE 100 MG/10ML IV SOLN
INTRAVENOUS | Status: DC | PRN
  Administered 2019-06-20: 50 mg via INTRAVENOUS

## 2019-06-20 MED ORDER — ONDANSETRON HCL 4 MG/2ML IJ SOLN
INTRAMUSCULAR | Status: DC | PRN
  Administered 2019-06-20: 4 mg via INTRAVENOUS

## 2019-06-20 MED ORDER — FAMOTIDINE 20 MG PO TABS
ORAL_TABLET | ORAL | Status: AC
  Filled 2019-06-20: qty 1

## 2019-06-20 MED ORDER — PROPOFOL 10 MG/ML IV BOLUS
INTRAVENOUS | Status: DC | PRN
  Administered 2019-06-20: 150 mg via INTRAVENOUS

## 2019-06-20 MED ORDER — BUPIVACAINE HCL (PF) 0.25 % IJ SOLN
INTRAMUSCULAR | Status: AC
  Filled 2019-06-20: qty 30

## 2019-06-20 MED ORDER — FENTANYL CITRATE (PF) 100 MCG/2ML IJ SOLN
INTRAMUSCULAR | Status: DC | PRN
  Administered 2019-06-20: 50 ug via INTRAVENOUS

## 2019-06-20 MED ORDER — DEXAMETHASONE SODIUM PHOSPHATE 10 MG/ML IJ SOLN
INTRAMUSCULAR | Status: DC | PRN
  Administered 2019-06-20: 10 mg via INTRAVENOUS

## 2019-06-20 MED ORDER — LACTATED RINGERS IV SOLN: INTRAVENOUS | Status: DC

## 2019-06-20 MED ORDER — PROPOFOL 10 MG/ML IV BOLUS
INTRAVENOUS | Status: AC
  Filled 2019-06-20: qty 40

## 2019-06-20 MED ORDER — GABAPENTIN 300 MG PO CAPS
300.0000 mg | ORAL_CAPSULE | ORAL | Status: AC
  Administered 2019-06-20: 300 mg via ORAL

## 2019-06-20 MED ORDER — CHLORHEXIDINE GLUCONATE CLOTH 2 % EX PADS
6.0000 | MEDICATED_PAD | Freq: Once | CUTANEOUS | Status: AC
  Administered 2019-06-20: 6 via TOPICAL

## 2019-06-20 MED ORDER — ACETAMINOPHEN 500 MG PO TABS
1000.0000 mg | ORAL_TABLET | ORAL | Status: AC
  Administered 2019-06-20: 1000 mg via ORAL

## 2019-06-20 MED ORDER — IBUPROFEN 800 MG PO TABS: 800.0000 mg | ORAL_TABLET | Freq: Three times a day (TID) | ORAL | 0 refills | Status: AC | PRN

## 2019-06-20 MED ORDER — FENTANYL CITRATE (PF) 100 MCG/2ML IJ SOLN: 25.0000 ug | INTRAMUSCULAR | Status: DC | PRN

## 2019-06-20 MED ORDER — LIDOCAINE-EPINEPHRINE 1 %-1:100000 IJ SOLN
INTRAMUSCULAR | Status: AC
  Filled 2019-06-20: qty 2

## 2019-06-20 MED ORDER — CELECOXIB 200 MG PO CAPS
ORAL_CAPSULE | ORAL | Status: AC
  Filled 2019-06-20: qty 1

## 2019-06-20 MED ORDER — BUPIVACAINE LIPOSOME 1.3 % IJ SUSP
INTRAMUSCULAR | Status: AC
  Filled 2019-06-20: qty 20

## 2019-06-20 MED ORDER — ACETAMINOPHEN 500 MG PO TABS
ORAL_TABLET | ORAL | Status: AC
  Filled 2019-06-20: qty 2

## 2019-06-20 MED ORDER — FENTANYL CITRATE (PF) 100 MCG/2ML IJ SOLN
INTRAMUSCULAR | Status: AC
  Filled 2019-06-20: qty 2

## 2019-06-20 MED ORDER — ONDANSETRON HCL 4 MG/2ML IJ SOLN: 4.0000 mg | Freq: Once | INTRAMUSCULAR | Status: DC | PRN

## 2019-06-20 MED ORDER — GABAPENTIN 300 MG PO CAPS
ORAL_CAPSULE | ORAL | Status: AC
  Filled 2019-06-20: qty 1

## 2019-06-20 SURGICAL SUPPLY — 33 items
BLADE SURG 15 STRL LF DISP TIS (BLADE) ×1 IMPLANT
BLADE SURG 15 STRL SS (BLADE) ×1
CANISTER SUCT 1200ML W/VALVE (MISCELLANEOUS) ×2 IMPLANT
CHLORAPREP W/TINT 26 (MISCELLANEOUS) ×2 IMPLANT
COVER WAND RF STERILE (DRAPES) ×2 IMPLANT
DERMABOND ADVANCED (GAUZE/BANDAGES/DRESSINGS) ×1
DERMABOND ADVANCED .7 DNX12 (GAUZE/BANDAGES/DRESSINGS) ×1 IMPLANT
DRAPE LAPAROTOMY 77X122 PED (DRAPES) ×2 IMPLANT
ELECT CAUTERY BLADE TIP 2.5 (TIP) ×2
ELECT REM PT RETURN 9FT ADLT (ELECTROSURGICAL) ×2
ELECTRODE CAUTERY BLDE TIP 2.5 (TIP) ×1 IMPLANT
ELECTRODE REM PT RTRN 9FT ADLT (ELECTROSURGICAL) ×1 IMPLANT
GLOVE BIO SURGEON STRL SZ 6.5 (GLOVE) ×2 IMPLANT
GLOVE INDICATOR 7.0 STRL GRN (GLOVE) ×4 IMPLANT
GOWN STRL REUS W/ TWL LRG LVL3 (GOWN DISPOSABLE) ×2 IMPLANT
GOWN STRL REUS W/TWL LRG LVL3 (GOWN DISPOSABLE) ×2
KIT TURNOVER KIT A (KITS) ×2 IMPLANT
LABEL OR SOLS (LABEL) ×2 IMPLANT
MESH VENTRALEX ST 1-7/10 CRC S (Mesh General) ×2 IMPLANT
NEEDLE HYPO 22GX1.5 SAFETY (NEEDLE) ×4 IMPLANT
NS IRRIG 500ML POUR BTL (IV SOLUTION) ×2 IMPLANT
PACK BASIN MINOR (MISCELLANEOUS) ×2 IMPLANT
STRIP CLOSURE SKIN 1/2X4 (GAUZE/BANDAGES/DRESSINGS) ×2 IMPLANT
SUT ETHIBOND NAB MO 7 #0 18IN (SUTURE) ×2 IMPLANT
SUT MNCRL 4-0 (SUTURE) ×1
SUT MNCRL 4-0 27XMFL (SUTURE) ×1
SUT VIC AB 3-0 SH 27 (SUTURE) ×1
SUT VIC AB 3-0 SH 27X BRD (SUTURE) ×1 IMPLANT
SUT VICRYL 2-0 SH 8X27 (SUTURE) IMPLANT
SUTURE MNCRL 4-0 27XMF (SUTURE) ×1 IMPLANT
SYR 10ML LL (SYRINGE) ×2 IMPLANT
SYR 20ML LL LF (SYRINGE) ×2 IMPLANT
SYR BULB IRRIG 60ML STRL (SYRINGE) ×2 IMPLANT

## 2019-06-20 NOTE — Op Note (Signed)
Operative Note  Pre-operative Diagnosis: umbilical/ventral hernia  Post-operative Diagnosis: same  Operation: umbilical/ventral hernia repair with mesh  Surgeon: Fredirick Maudlin, MD  Anesthesia: GETA  Assistant:Jamie Britta Mccreedy, RNFA  Findings: 1 cm defect in midline fascia  Estimated Blood Loss: less than 2 cc         Drains: none         Specimens: none          Complications: None immediately apparent         Condition: stable  Procedure Details  The patient was identified in the preoperative holding area. The benefits, complications, treatment options, and expected outcomes were discussed with the patient. The risks of bleeding, infection, recurrence of symptoms, failure to resolve symptoms, bowel injury, any of which could require further surgery were reviewed with the patient. The patient agreed to accept these risks. The patient was then taken to the operating room, identified as Troy Gardner and the procedure verified.  A time out was performed and the above information confirmed.  Prior to the induction of general anesthesia, antibiotic prophylaxis was administered. VTE prophylaxis was in place. General endotracheal anesthesia was then administered and tolerated well. After induction, the abdomen was prepped with Chloraprep and draped in standard sterile fashion. The patient was positioned in the supine position.  A one-to-one mixture of 0.25% bupivacaine and 1% lidocaine with epinephrine was infiltrated along the proposed incision.  The umbilical incision was created over the hernia sac and electrocautery was used to dissect through subcutaneous tissue. The hernia was dissected free from adjacent tissue and fascia. The hernia sac was entered and the sac was excised. Care was taken to avoid any injury to the bowel. The 4.3 cm Ventralex ST mesh was introduced and attached to the fascia using interrupted 0 Ethibond sutures in standard fashion.  The fascia was closed over the mesh  with interrupted 2-0 Ethibond suture.  The cutaneous tissue was closed with 3-0 Vicryl and skin was closed with a subcuticular 4-0 Monocryl in a subcuticular fashion. Dermabond was applied to the skin, followed by Steri-Strips. Patient tolerated procedure well and there were no immediate complications identified. Needle, instrument, and sponge counts were reported to be correct by the nursing staff.  Fredirick Maudlin, MD FACS

## 2019-06-20 NOTE — Anesthesia Preprocedure Evaluation (Signed)
Anesthesia Evaluation  Patient identified by MRN, date of birth, ID band Patient awake    Reviewed: Allergy & Precautions, NPO status , Patient's Chart, lab work & pertinent test results  History of Anesthesia Complications Negative for: history of anesthetic complications  Airway Mallampati: III       Dental  (+) Edentulous Upper, Edentulous Lower   Pulmonary neg sleep apnea, neg COPD, Not current smoker,           Cardiovascular (-) hypertension(-) Past MI and (-) CHF (-) dysrhythmias (-) Valvular Problems/Murmurs     Neuro/Psych neg Seizures    GI/Hepatic Neg liver ROS, neg GERD  ,  Endo/Other  neg diabetes  Renal/GU negative Renal ROS     Musculoskeletal   Abdominal (+) + obese,   Peds  Hematology   Anesthesia Other Findings   Reproductive/Obstetrics                             Anesthesia Physical Anesthesia Plan  ASA: II  Anesthesia Plan: General   Post-op Pain Management:    Induction: Intravenous  PONV Risk Score and Plan: 2 and Ondansetron and Dexamethasone  Airway Management Planned: Oral ETT  Additional Equipment:   Intra-op Plan:   Post-operative Plan:   Informed Consent: I have reviewed the patients History and Physical, chart, labs and discussed the procedure including the risks, benefits and alternatives for the proposed anesthesia with the patient or authorized representative who has indicated his/her understanding and acceptance.       Plan Discussed with:   Anesthesia Plan Comments:         Anesthesia Quick Evaluation

## 2019-06-20 NOTE — Discharge Instructions (Signed)
Open Hernia Repair, Adult, Care After This sheet gives you information about how to care for yourself after your procedure. Your health care provider may also give you more specific instructions. If you have problems or questions, contact your health care provider. What can I expect after the procedure? After the procedure, it is common to have:  Mild discomfort.  Slight bruising.  Minor swelling.  Pain in the abdomen. Follow these instructions at home: Incision care   Follow instructions from your health care provider about how to take care of your incision area. Make sure you: ? Wash your hands with soap and water before you change your bandage (dressing). If soap and water are not available, use hand sanitizer. ? Change your dressing as told by your health care provider. ? Leave stitches (sutures), skin glue, or adhesive strips in place. These skin closures may need to stay in place for 2 weeks or longer. If adhesive strip edges start to loosen and curl up, you may trim the loose edges. Do not remove adhesive strips completely unless your health care provider tells you to do that.  Check your incision area every day for signs of infection. Check for: ? More redness, swelling, or pain. ? More fluid or blood. ? Warmth. ? Pus or a bad smell. Activity  Do not drive or use heavy machinery while taking prescription pain medicine. Do not drive until your health care provider approves.  Until your health care provider approves: ? Do not lift anything that is heavier than 10 lb (4.5 kg). ? Do not play contact sports.  Return to your normal activities as told by your health care provider. Ask your health care provider what activities are safe. General instructions  To prevent or treat constipation while you are taking prescription pain medicine, your health care provider may recommend that you: ? Drink enough fluid to keep your urine clear or pale yellow. ? Take over-the-counter or  prescription medicines. ? Eat foods that are high in fiber, such as fresh fruits and vegetables, whole grains, and beans. ? Limit foods that are high in fat and processed sugars, such as fried and sweet foods.  Take over-the-counter and prescription medicines only as told by your health care provider.  Do not take tub baths or go swimming until your health care provider approves.  Keep all follow-up visits as told by your health care provider. This is important. Contact a health care provider if:  You develop a rash.  You have more redness, swelling, or pain around your incision.  You have more fluid or blood coming from your incision.  Your incision feels warm to the touch.  You have pus or a bad smell coming from your incision.  You have a fever or chills.  You have blood in your stool (feces).  You have not had a bowel movement in 2-3 days.  Your pain is not controlled with medicine. Get help right away if:  You have chest pain or shortness of breath.  You feel light-headed or feel faint.  You have severe pain.  You vomit and your pain is worse. This information is not intended to replace advice given to you by your health care provider. Make sure you discuss any questions you have with your health care provider. Document Revised: 01/21/2017 Document Reviewed: 07/23/2015 Elsevier Patient Education  2020 [employer]  

## 2019-06-20 NOTE — Anesthesia Procedure Notes (Signed)
Procedure Name: Intubation Date/Time: 06/20/2019 9:31 AM Performed by: Louann Sjogren, CRNA Pre-anesthesia Checklist: Patient identified, Patient being monitored, Timeout performed, Emergency Drugs available and Suction available Patient Re-evaluated:Patient Re-evaluated prior to induction Oxygen Delivery Method: Circle system utilized Preoxygenation: Pre-oxygenation with 100% oxygen Induction Type: IV induction Ventilation: Mask ventilation without difficulty Laryngoscope Size: Mac and 4 Grade View: Grade I Tube type: Oral Tube size: 7.5 mm Number of attempts: 1 Airway Equipment and Method: Stylet Placement Confirmation: ETT inserted through vocal cords under direct vision,  positive ETCO2 and breath sounds checked- equal and bilateral Secured at: 22 cm Tube secured with: Tape Dental Injury: Teeth and Oropharynx as per pre-operative assessment

## 2019-06-20 NOTE — Anesthesia Postprocedure Evaluation (Signed)
Anesthesia Post Note  Patient: Troy Gardner  Procedure(s) Performed: HERNIA REPAIR UMBILICAL ADULT, Open (N/A Abdomen)  Patient location during evaluation: PACU Anesthesia Type: General Level of consciousness: awake and alert Pain management: pain level controlled Vital Signs Assessment: post-procedure vital signs reviewed and stable Respiratory status: spontaneous breathing and respiratory function stable Cardiovascular status: stable Anesthetic complications: no     Last Vitals:  Vitals:   06/20/19 1112 06/20/19 1117  BP: 124/75 140/70  Pulse: 62 68  Resp: 13 18  Temp: (!) 36.1 C (!) 36.1 C  SpO2: 97% 96%    Last Pain:  Vitals:   06/20/19 1117  TempSrc: Temporal  PainSc: 0-No pain                 Edker Punt K

## 2019-06-20 NOTE — Interval H&P Note (Signed)
History and Physical Interval Note:  06/20/2019 9:09 AM  Troy Gardner  has presented today for surgery, with the diagnosis of Umbilical hernia.  The various methods of treatment have been discussed with the patient and family. After consideration of risks, benefits and other options for treatment, the patient has consented to  Procedure(s): HERNIA REPAIR UMBILICAL ADULT, Open (N/A) as a surgical intervention.  The patient's history has been reviewed, patient examined, no change in status, stable for surgery.  I have reviewed the patient's chart and labs.  Questions were answered to the patient's satisfaction.     Fredirick Maudlin

## 2019-06-20 NOTE — Transfer of Care (Signed)
Immediate Anesthesia Transfer of Care Note  Patient: Troy Gardner  Procedure(s) Performed: HERNIA REPAIR UMBILICAL ADULT, Open (N/A Abdomen)  Patient Location: PACU  Anesthesia Type:General  Level of Consciousness: awake  Airway & Oxygen Therapy: Patient Spontanous Breathing  Post-op Assessment: Report given to RN  Post vital signs: Reviewed and stable  Last Vitals:  Vitals Value Taken Time  BP 132/89 06/20/19 1042  Temp 36 C 06/20/19 1042  Pulse 65 06/20/19 1046  Resp 12 06/20/19 1046  SpO2 95 % 06/20/19 1046  Vitals shown include unvalidated device data.  Last Pain:  Vitals:   06/20/19 1042  TempSrc:   PainSc: Asleep         Complications: No apparent anesthesia complications

## 2019-07-10 ENCOUNTER — Encounter: Payer: Self-pay | Admitting: Physician Assistant

## 2019-07-10 ENCOUNTER — Ambulatory Visit (INDEPENDENT_AMBULATORY_CARE_PROVIDER_SITE_OTHER): Payer: Self-pay | Admitting: Physician Assistant

## 2019-07-10 ENCOUNTER — Telehealth: Payer: Medicare Other | Admitting: General Surgery

## 2019-07-10 ENCOUNTER — Other Ambulatory Visit: Payer: Self-pay

## 2019-07-10 VITALS — BP 135/77 | HR 65 | Temp 97.3°F | Ht 69.0 in | Wt 275.8 lb

## 2019-07-10 DIAGNOSIS — Z09 Encounter for follow-up examination after completed treatment for conditions other than malignant neoplasm: Secondary | ICD-10-CM

## 2019-07-10 DIAGNOSIS — K429 Umbilical hernia without obstruction or gangrene: Secondary | ICD-10-CM

## 2019-07-10 NOTE — Patient Instructions (Addendum)
Otho Ket, PA discussed with patient to follow with our office as needed.   Patient is to refrain from any heavy lifting or bending of 20 pounds or more for two more weeks for a total of six weeks.

## 2019-07-10 NOTE — Progress Notes (Signed)
[facility] POST-OP OFFICE VISIT  07/10/2019  HPI: Troy Gardner is a 79 y.o. male 20 days s/p umbilical/ventral hernia repair with Dr Celine Ahr.   Overall doing well Mild, very intermittent umbilical pain Not requiring pain medications No fever, chills, nausea, or emesis Tolerating PO No issues with bowel function Mobilizing  Vital signs: BP 135/77   Pulse 65   Temp (!) 97.3 F (36.3 C) (Temporal)   Ht 5\' 9"  (1.753 m)   Wt 275 lb 12.8 oz (125.1 kg)   SpO2 98%   BMI 40.73 kg/m    Physical Exam: Constitutional: Well appearing male,. NAD Abdomen: Soft, non-tender, non-distended, no rebound/guarding Skin: Umbilical incision is well healed, no erythema or drainage  Assessment/Plan: This is a 79 y.o. male 20 days s/p umbilical/ventral hernia repair   - pain control prn  - reviewed lifting restrictions and post-op instructions  - no other issues  - rtc prn  -- Edison Simon, PA-C [facility] 07/10/2019, 10:22 AM [phone] M-F: 7am - 4pm

## 2020-04-03 ENCOUNTER — Other Ambulatory Visit: Payer: Self-pay

## 2020-04-03 ENCOUNTER — Ambulatory Visit (INDEPENDENT_AMBULATORY_CARE_PROVIDER_SITE_OTHER): Payer: Medicare Other | Admitting: Dermatology

## 2020-04-03 DIAGNOSIS — L820 Inflamed seborrheic keratosis: Secondary | ICD-10-CM | POA: Diagnosis not present

## 2020-04-03 DIAGNOSIS — L570 Actinic keratosis: Secondary | ICD-10-CM

## 2020-04-03 DIAGNOSIS — D229 Melanocytic nevi, unspecified: Secondary | ICD-10-CM

## 2020-04-03 DIAGNOSIS — Z1283 Encounter for screening for malignant neoplasm of skin: Secondary | ICD-10-CM

## 2020-04-03 DIAGNOSIS — L814 Other melanin hyperpigmentation: Secondary | ICD-10-CM

## 2020-04-03 DIAGNOSIS — L821 Other seborrheic keratosis: Secondary | ICD-10-CM

## 2020-04-03 DIAGNOSIS — D1800 Hemangioma unspecified site: Secondary | ICD-10-CM

## 2020-04-03 DIAGNOSIS — L578 Other skin changes due to chronic exposure to nonionizing radiation: Secondary | ICD-10-CM

## 2020-04-03 DIAGNOSIS — L738 Other specified follicular disorders: Secondary | ICD-10-CM

## 2020-04-03 NOTE — Progress Notes (Signed)
   Follow-Up Visit   Subjective  Troy Gardner is a 80 y.o. male who presents for the following: total body skin exam (1 yr f/u hx of aks). History of atypical squamous cells of undetermined significance. The patient presents for Upper Body Skin Exam (UBSE) for skin cancer screening and mole check.  The following portions of the chart were reviewed this encounter and updated as appropriate:   Tobacco  Allergies  Meds  Problems  Med Hx  Surg Hx  Fam Hx     Review of Systems:  No other skin or systemic complaints except as noted in HPI or Assessment and Plan.  Objective  Well appearing patient in no apparent distress; mood and affect are within normal limits.  All skin waist up examined.  Objective  Scalp x 3, L hand/arm x 15 (18): Pink scaly macules   Objective  Left dorsum hand x 1: Erythematous keratotic or waxy stuck-on papule or plaque.    Assessment & Plan    Lentigines - Scattered tan macules - Discussed due to sun exposure - Benign, observe - Call for any changes  Seborrheic Keratoses - Stuck-on, waxy, tan-brown papules and plaques  - Discussed benign etiology and prognosis. - Observe - Call for any changes  Melanocytic Nevi - Tan-brown and/or pink-flesh-colored symmetric macules and papules - Benign appearing on exam today - Observation - Call clinic for new or changing moles - Recommend daily use of broad spectrum spf 30+ sunscreen to sun-exposed areas.   Hemangiomas - Red papules - Discussed benign nature - Observe - Call for any changes  Actinic Damage - Chronic, secondary to cumulative UV/sun exposure - diffuse scaly erythematous macules with underlying dyspigmentation - Recommend daily broad spectrum sunscreen SPF 30+ to sun-exposed areas, reapply every 2 hours as needed.  - Call for new or changing lesions.  Skin cancer screening performed today.  Sebaceous Hyperplasia - Small yellow papules with a central dell - Benign -  Observe  AK (actinic keratosis) (18) Scalp x 3, L hand/arm x 15  Destruction of lesion - Scalp x 3, L hand/arm x 15 Complexity: simple   Destruction method: cryotherapy   Informed consent: discussed and consent obtained   Timeout:  patient name, date of birth, surgical site, and procedure verified Lesion destroyed using liquid nitrogen: Yes   Region frozen until ice ball extended beyond lesion: Yes   Outcome: patient tolerated procedure well with no complications   Post-procedure details: wound care instructions given    Inflamed seborrheic keratosis Left dorsum hand x 1  Destruction of lesion - Left dorsum hand x 1 Complexity: simple   Destruction method: cryotherapy   Informed consent: discussed and consent obtained   Timeout:  patient name, date of birth, surgical site, and procedure verified Lesion destroyed using liquid nitrogen: Yes   Region frozen until ice ball extended beyond lesion: Yes   Outcome: patient tolerated procedure well with no complications   Post-procedure details: wound care instructions given    Skin cancer screening  Return in about 1 year (around 04/03/2021) for UBSE, Hx of AKs.   I, Othelia Pulling, RMA, am acting as scribe for Sarina Ser, MD .  Documentation: I have reviewed the above documentation for accuracy and completeness, and I agree with the above.  Sarina Ser, MD

## 2020-04-06 ENCOUNTER — Encounter: Payer: Self-pay | Admitting: Dermatology

## 2020-12-12 ENCOUNTER — Encounter: Payer: Self-pay | Admitting: General Surgery

## 2021-04-08 ENCOUNTER — Other Ambulatory Visit: Payer: Self-pay

## 2021-04-08 ENCOUNTER — Ambulatory Visit: Payer: Medicare Other | Admitting: Dermatology

## 2021-04-08 DIAGNOSIS — L821 Other seborrheic keratosis: Secondary | ICD-10-CM

## 2021-04-08 DIAGNOSIS — L738 Other specified follicular disorders: Secondary | ICD-10-CM

## 2021-04-08 DIAGNOSIS — Z872 Personal history of diseases of the skin and subcutaneous tissue: Secondary | ICD-10-CM

## 2021-04-08 DIAGNOSIS — D692 Other nonthrombocytopenic purpura: Secondary | ICD-10-CM | POA: Diagnosis not present

## 2021-04-08 DIAGNOSIS — L570 Actinic keratosis: Secondary | ICD-10-CM

## 2021-04-08 DIAGNOSIS — C4491 Basal cell carcinoma of skin, unspecified: Secondary | ICD-10-CM

## 2021-04-08 DIAGNOSIS — L578 Other skin changes due to chronic exposure to nonionizing radiation: Secondary | ICD-10-CM

## 2021-04-08 DIAGNOSIS — Z1283 Encounter for screening for malignant neoplasm of skin: Secondary | ICD-10-CM | POA: Diagnosis not present

## 2021-04-08 DIAGNOSIS — L918 Other hypertrophic disorders of the skin: Secondary | ICD-10-CM

## 2021-04-08 DIAGNOSIS — L820 Inflamed seborrheic keratosis: Secondary | ICD-10-CM | POA: Diagnosis not present

## 2021-04-08 DIAGNOSIS — D1800 Hemangioma unspecified site: Secondary | ICD-10-CM

## 2021-04-08 DIAGNOSIS — C4441 Basal cell carcinoma of skin of scalp and neck: Secondary | ICD-10-CM | POA: Diagnosis not present

## 2021-04-08 DIAGNOSIS — D492 Neoplasm of unspecified behavior of bone, soft tissue, and skin: Secondary | ICD-10-CM

## 2021-04-08 DIAGNOSIS — L814 Other melanin hyperpigmentation: Secondary | ICD-10-CM

## 2021-04-08 DIAGNOSIS — D229 Melanocytic nevi, unspecified: Secondary | ICD-10-CM

## 2021-04-08 HISTORY — DX: Basal cell carcinoma of skin, unspecified: C44.91

## 2021-04-08 NOTE — Patient Instructions (Addendum)

## 2021-04-08 NOTE — Progress Notes (Signed)
Follow-Up Visit   Subjective  Troy Gardner is a 81 y.o. male who presents for the following: Upper body skin exam (Hx of AKS) and check spot (Under R ear, tender). The patient presents for Upper Body Skin Exam (UBSE) for skin cancer screening and mole check.  The patient has spots, moles and lesions to be evaluated, some may be new or changing and the patient has concerns that these could be cancer.   The following portions of the chart were reviewed this encounter and updated as appropriate:   Tobacco   Allergies   Meds   Problems   Med Hx   Surg Hx   Fam Hx      Review of Systems:  No other skin or systemic complaints except as noted in HPI or Assessment and Plan.  Objective  Well appearing patient in no apparent distress; mood and affect are within normal limits.  A full examination was performed including scalp, head, eyes, ears, nose, lips, neck, chest, axillae, abdomen, back, buttocks, bilateral upper extremities, bilateral lower extremities, hands, feet, fingers, toes, fingernails, and toenails. All findings within normal limits unless otherwise noted below.  Left Forearm x 1 Stuck on waxy paps with erythema  R cheek x 1 Pink scaly macules  R lat neck inferior post auricular Pink pap 0.8cm     forehead Small yellow papules with a central dell   Assessment & Plan   Lentigines - Scattered tan macules - Due to sun exposure - Benign-appearing, observe - Recommend daily broad spectrum sunscreen SPF 30+ to sun-exposed areas, reapply every 2 hours as needed. - Call for any changes  Seborrheic Keratoses - Stuck-on, waxy, tan-brown papules and/or plaques  - Benign-appearing - Discussed benign etiology and prognosis. - Observe - Call for any changes  Melanocytic Nevi - Tan-brown and/or pink-flesh-colored symmetric macules and papules - Benign appearing on exam today - Observation - Call clinic for new or changing moles - Recommend daily use of broad spectrum  spf 30+ sunscreen to sun-exposed areas.   Hemangiomas - Red papules - Discussed benign nature - Observe - Call for any changes  Actinic Damage - Chronic condition, secondary to cumulative UV/sun exposure - diffuse scaly erythematous macules with underlying dyspigmentation - Recommend daily broad spectrum sunscreen SPF 30+ to sun-exposed areas, reapply every 2 hours as needed.  - Staying in the shade or wearing long sleeves, sun glasses (UVA+UVB protection) and wide brim hats (4-inch brim around the entire circumference of the hat) are also recommended for sun protection.  - Call for new or changing lesions.  Skin cancer screening performed today.  Purpura - Chronic; persistent and recurrent.  Treatable, but not curable. - Violaceous macules and patches - Benign - Related to trauma, age, sun damage and/or use of blood thinners, chronic use of topical and/or oral steroids - Observe - Can use OTC arnica containing moisturizer such as Dermend Bruise Formula if desired - Call for worsening or other concerns  - arms  Acrochordons (Skin Tags) - Fleshy, skin-colored pedunculated papules - Benign appearing.  - Observe. - If desired, they can be removed with an in office procedure that is not covered by insurance. - Please call the clinic if you notice any new or changing lesions.   Inflamed seborrheic keratosis Left Forearm x 1  Destruction of lesion - Left Forearm x 1 Complexity: simple   Destruction method: cryotherapy   Informed consent: discussed and consent obtained   Timeout:  patient name, date of  birth, surgical site, and procedure verified Lesion destroyed using liquid nitrogen: Yes   Region frozen until ice ball extended beyond lesion: Yes   Outcome: patient tolerated procedure well with no complications   Post-procedure details: wound care instructions given    AK (actinic keratosis) R cheek x 1  Destruction of lesion - R cheek x 1 Complexity: simple   Destruction  method: cryotherapy   Informed consent: discussed and consent obtained   Timeout:  patient name, date of birth, surgical site, and procedure verified Lesion destroyed using liquid nitrogen: Yes   Region frozen until ice ball extended beyond lesion: Yes   Outcome: patient tolerated procedure well with no complications   Post-procedure details: wound care instructions given    Neoplasm of skin R lat neck inferior post auricular  Epidermal / dermal shaving  Lesion diameter (cm):  0.8 Informed consent: discussed and consent obtained   Timeout: patient name, date of birth, surgical site, and procedure verified   Procedure prep:  Patient was prepped and draped in usual sterile fashion Prep type:  Isopropyl alcohol Anesthesia: the lesion was anesthetized in a standard fashion   Anesthetic:  1% lidocaine w/ epinephrine 1-100,000 buffered w/ 8.4% NaHCO3 Instrument used: flexible razor blade   Hemostasis achieved with: pressure, aluminum chloride and electrodesiccation   Outcome: patient tolerated procedure well   Post-procedure details: sterile dressing applied and wound care instructions given   Dressing type: bandage and bacitracin    Destruction of lesion Complexity: extensive   Destruction method: electrodesiccation and curettage   Informed consent: discussed and consent obtained   Timeout:  patient name, date of birth, surgical site, and procedure verified Procedure prep:  Patient was prepped and draped in usual sterile fashion Prep type:  Isopropyl alcohol Anesthesia: the lesion was anesthetized in a standard fashion   Anesthetic:  1% lidocaine w/ epinephrine 1-100,000 buffered w/ 8.4% NaHCO3 Curettage performed in three different directions: Yes   Electrodesiccation performed over the curetted area: Yes   Lesion length (cm):  0.8 Lesion width (cm):  0.8 Margin per side (cm):  0.2 Final wound size (cm):  1.2 Hemostasis achieved with:  pressure, aluminum chloride and  electrodesiccation Outcome: patient tolerated procedure well with no complications   Post-procedure details: sterile dressing applied and wound care instructions given   Dressing type: bandage and bacitracin    Specimen 1 - Surgical pathology Differential Diagnosis: D48.5 R/O BCC  Check Margins: No Pink pap 0.8cm EDC  Sebaceous hyperplasia forehead  Benign, observe.    Skin cancer screening   Return in about 1 year (around 04/08/2022) for TBSE, Hx of AKs.  I, Othelia Pulling, RMA, am acting as scribe for Sarina Ser, MD . Documentation: I have reviewed the above documentation for accuracy and completeness, and I agree with the above.  Sarina Ser, MD

## 2021-04-11 ENCOUNTER — Encounter: Payer: Self-pay | Admitting: Dermatology

## 2021-04-13 ENCOUNTER — Telehealth: Payer: Self-pay

## 2021-04-13 NOTE — Telephone Encounter (Signed)
LM on VM please return my call  

## 2021-04-13 NOTE — Telephone Encounter (Signed)
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

## 2021-04-15 ENCOUNTER — Telehealth: Payer: Self-pay

## 2021-04-15 NOTE — Telephone Encounter (Addendum)
Called patient to discuss biopsy results. No answer. Left message for patient to call office.     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

## 2021-04-23 ENCOUNTER — Telehealth: Payer: Self-pay

## 2021-04-23 NOTE — Telephone Encounter (Signed)
-----   Message from Ralene Bathe, MD sent at 04/09/2021  5:39 PM EST ----- ?Diagnosis ?Skin , right lat neck inferior post auricular ?BASAL CELL CARCINOMA WITH FOCAL SCLEROSIS, SEE DESCRIPTION ? ?Cancer - BCC ?Already treated ?Recheck next visit ?

## 2021-04-23 NOTE — Telephone Encounter (Signed)
Advised patient of results/hd  

## 2022-03-12 DIAGNOSIS — M4306 Spondylolysis, lumbar region: Secondary | ICD-10-CM | POA: Diagnosis not present

## 2022-03-12 DIAGNOSIS — M9903 Segmental and somatic dysfunction of lumbar region: Secondary | ICD-10-CM | POA: Diagnosis not present

## 2022-03-12 DIAGNOSIS — M5432 Sciatica, left side: Secondary | ICD-10-CM | POA: Diagnosis not present

## 2022-03-12 DIAGNOSIS — M9905 Segmental and somatic dysfunction of pelvic region: Secondary | ICD-10-CM | POA: Diagnosis not present

## 2022-04-09 DIAGNOSIS — M9905 Segmental and somatic dysfunction of pelvic region: Secondary | ICD-10-CM | POA: Diagnosis not present

## 2022-04-09 DIAGNOSIS — M4306 Spondylolysis, lumbar region: Secondary | ICD-10-CM | POA: Diagnosis not present

## 2022-04-09 DIAGNOSIS — M9903 Segmental and somatic dysfunction of lumbar region: Secondary | ICD-10-CM | POA: Diagnosis not present

## 2022-04-09 DIAGNOSIS — M5432 Sciatica, left side: Secondary | ICD-10-CM | POA: Diagnosis not present

## 2022-04-12 ENCOUNTER — Ambulatory Visit: Payer: Medicare HMO | Admitting: Dermatology

## 2022-04-12 ENCOUNTER — Encounter: Payer: Self-pay | Admitting: Dermatology

## 2022-04-12 VITALS — BP 117/71 | HR 70

## 2022-04-12 DIAGNOSIS — D229 Melanocytic nevi, unspecified: Secondary | ICD-10-CM | POA: Diagnosis not present

## 2022-04-12 DIAGNOSIS — L578 Other skin changes due to chronic exposure to nonionizing radiation: Secondary | ICD-10-CM | POA: Diagnosis not present

## 2022-04-12 DIAGNOSIS — L814 Other melanin hyperpigmentation: Secondary | ICD-10-CM

## 2022-04-12 DIAGNOSIS — L570 Actinic keratosis: Secondary | ICD-10-CM | POA: Diagnosis not present

## 2022-04-12 DIAGNOSIS — L820 Inflamed seborrheic keratosis: Secondary | ICD-10-CM | POA: Diagnosis not present

## 2022-04-12 DIAGNOSIS — L821 Other seborrheic keratosis: Secondary | ICD-10-CM | POA: Diagnosis not present

## 2022-04-12 DIAGNOSIS — Z1283 Encounter for screening for malignant neoplasm of skin: Secondary | ICD-10-CM

## 2022-04-12 DIAGNOSIS — Z85828 Personal history of other malignant neoplasm of skin: Secondary | ICD-10-CM

## 2022-04-12 NOTE — Patient Instructions (Addendum)
Cryotherapy Aftercare  Wash gently with soap and water everyday.   Apply Vaseline and Band-Aid daily until healed.    Recommend daily broad spectrum sunscreen SPF 30+ to sun-exposed areas, reapply every 2 hours as needed. Call for new or changing lesions.  Staying in the shade or wearing long sleeves, sun glasses (UVA+UVB protection) and wide brim hats (4-inch brim around the entire circumference of the hat) are also recommended for sun protection.    Melanoma ABCDEs  Melanoma is the most dangerous type of skin cancer, and is the leading cause of death from skin disease.  You are more likely to develop melanoma if you: Have light-colored skin, light-colored eyes, or red or blond hair Spend a lot of time in the sun Tan regularly, either outdoors or in a tanning bed Have had blistering sunburns, especially during childhood Have a close family member who has had a melanoma Have atypical moles or large birthmarks  Early detection of melanoma is key since treatment is typically straightforward and cure rates are extremely high if we catch it early.   The first sign of melanoma is often a change in a mole or a new dark spot.  The ABCDE system is a way of remembering the signs of melanoma.  A for asymmetry:  The two halves do not match. B for border:  The edges of the growth are irregular. C for color:  A mixture of colors are present instead of an even brown color. D for diameter:  Melanomas are usually (but not always) greater than 87m - the size of a pencil eraser. E for evolution:  The spot keeps changing in size, shape, and color.  Please check your skin once per month between visits. You can use a small mirror in front and a large mirror behind you to keep an eye on the back side or your body.   If you see any new or changing lesions before your next follow-up, please call to schedule a visit.  Please continue daily skin protection including broad spectrum sunscreen SPF 30+ to  sun-exposed areas, reapplying every 2 hours as needed when you're outdoors.   Staying in the shade or wearing long sleeves, sun glasses (UVA+UVB protection) and wide brim hats (4-inch brim around the entire circumference of the hat) are also recommended for sun protection.    Due to recent changes in healthcare laws, you may see results of your pathology and/or laboratory studies on MyChart before the doctors have had a chance to review them. We understand that in some cases there may be results that are confusing or concerning to you. Please understand that not all results are received at the same time and often the doctors may need to interpret multiple results in order to provide you with the best plan of care or course of treatment. Therefore, we ask that you please give u[place]2 business days to thoroughly review all your results before contacting the office for clarification. Should we see a critical lab result, you will be contacted sooner.   If You Need Anything After Your Visit  If you have any questions or concerns for your doctor, please call our main line at [phone] press option 4 to reach your doctor's medical assistant. If no one answers, please leave a voicemail as directed and we will return your call as soon as possible. Messages left after 4 pm will be answered the following business day.   You may also send [place] message via  MyChart. We typically respond to MyChart messages within 1-2 business days.  For prescription refills, please ask your pharmacy to contact our office. Our fax number is [phone].  If you have an urgent issue when the clinic is closed that cannot wait until the next business day, you can page your doctor at the number below.    Please note that while we do our best to be available for urgent issues outside of office hours, we are not available 24/7.   If you have an urgent issue and are unable to reach [place], you may choose to seek medical care at your  doctor's office, retail clinic, urgent care center, or emergency room.  If you have a medical emergency, please immediately call 911 or go to the emergency department.  Pager Numbers  - Dr. Nehemiah Massed: [phone]  - Dr. Laurence Ferrari: [phone]  - Dr. Nicole Kindred: [phone]  In the event of inclement weather, please call our main line at [phone] for an update on the status of any delays or closures.  Dermatology Medication Tips: Please keep the boxes that topical medications come in in order to help keep track of the instructions about where and how to use these. Pharmacies typically print the medication instructions only on the boxes and not directly on the medication tubes.   If your medication is too expensive, please contact our office at [phone] option 4 or send [place] a message through [place].   We are unable to tell what your co-pay for medications will be in advance as this is different depending on your insurance coverage. However, we may be able to find a substitute medication at lower cost or fill out paperwork to get insurance to cover a needed medication.   If a prior authorization is required to get your medication covered by your insurance company, please allow [place] 1-2 business days to complete this process.  Drug prices often vary depending on where the prescription is filled and some pharmacies may offer cheaper prices.  The website www.goodrx.com contains coupons for medications through different pharmacies. The prices here do not account for what the cost may be with help from insurance (it may be cheaper with your insurance), but the website can give you the price if you did not use any insurance.  - You can print the associated coupon and take it with your prescription to the pharmacy.  - You may also stop by our office during regular business hours and pick up a GoodRx coupon card.  - If you need your prescription sent electronically to a different pharmacy, notify  our office through [facility] or by phone at [phone] option 4.     Si Usted Necesita Algo Despus de Su Visita  Tambin puede enviarnos un mensaje a travs de Pharmacist, community. Por lo general respondemos a los mensajes de MyChart en el transcurso de 1 a 2 das hbiles.  Para renovar recetas, por favor pida a su farmacia que se ponga en contacto con nuestra oficina. Harland Dingwall de fax es [place] [phone].  Si tiene un asunto urgente cuando la clnica est cerrada y que no puede esperar hasta el siguiente da hbil, puede llamar/localizar a su doctor(a) al nmero que aparece a continuacin.   Por favor, tenga en cuenta que aunque hacemos todo lo posible para estar disponibles para asuntos urgentes fuera del horario de [place], no estamos disponibles las 24 horas del da, los 7 das de la [place].   Si tiene un problema urgente y no  puede comunicarse con nosotros, puede optar por buscar atencin mdica  en el consultorio de su doctor(a), en una clnica privada, en un centro de atencin urgente o en una sala de emergencias.  Si tiene Engineering geologist, por favor llame inmediatamente al 911 o vaya a la sala de emergencias.  Nmeros de bper  - Dr. Nehemiah Massed: [phone]  - Dra. Moye: [phone]  - Dra. Nicole Kindred: [phone]  En caso de inclemencias del [place], por favor llame a Johnsie Kindred principal al [phone] para una actualizacin sobre el [place] de cualquier retraso o cierre.  Consejos para la medicacin en dermatologa: Por favor, guarde las cajas en las que vienen los medicamentos de uso tpico para ayudarle a seguir las instrucciones sobre dnde y cmo usarlos. Las farmacias generalmente imprimen las instrucciones del medicamento slo en las cajas y no directamente en los tubos del [place].   Si su medicamento es muy caro, por favor, pngase en contacto con Zigmund Daniel llamando al [phone] y presione la opcin 4 o envenos un mensaje a travs de  Pharmacist, community.   No podemos decirle cul ser su copago por los medicamentos por adelantado ya que esto es diferente dependiendo de la cobertura de su seguro. Sin embargo, es posible que podamos encontrar un medicamento sustituto a Electrical engineer un formulario para que el seguro cubra el medicamento que se considera necesario.   Si se requiere una autorizacin previa para que su compaa de seguros [place] su medicamento, por favor permtanos de 1 a 2 das hbiles para completar este proceso.  Los precios de los medicamentos varan con frecuencia dependiendo del Environmental consultant de dnde se surte la receta y alguna farmacias pueden ofrecer precios ms baratos.  El sitio web www.goodrx.com tiene cupones para medicamentos de Airline pilot. Los precios aqu no tienen en cuenta lo que podra costar con la ayuda del seguro (puede ser ms barato con su seguro), pero el sitio web puede darle el precio si no utiliz Research scientist (physical sciences).  - Puede imprimir el cupn correspondiente y llevarlo con su receta a la farmacia.  - Tambin puede pasar por nuestra oficina durante el horario de atencin regular y Charity fundraiser una tarjeta de cupones de GoodRx.  - Si necesita que su receta se enve electrnicamente a una farmacia diferente, informe a nuestra oficina a travs de MyChart de [place] o por telfono llamando al [phone] y presione la opcin 4.

## 2022-04-12 NOTE — Progress Notes (Unsigned)
Follow-Up Visit   Subjective  Troy Gardner is a 82 y.o. male who presents for the following: Annual Exam (HxBCC, HxAks . Areas of concern on scalp). The patient presents for Total-Body Skin Exam (TBSE) for skin cancer screening and mole check.  The patient has spots, moles and lesions to be evaluated, some may be new or changing and the patient has concerns that these could be cancer.  The following portions of the chart were reviewed this encounter and updated as appropriate:  Tobacco  Allergies  Meds  Problems  Med Hx  Surg Hx  Fam Hx     Review of Systems: No other skin or systemic complaints except as noted in HPI or Assessment and Plan.  Objective  Well appearing patient in no apparent distress; mood and affect are within normal limits.  A full examination was performed including scalp, head, eyes, ears, nose, lips, neck, chest, axillae, abdomen, back, buttocks, bilateral upper extremities, bilateral lower extremities, hands, feet, fingers, toes, fingernails, and toenails. All findings within normal limits unless otherwise noted below.  right temple x1 Erythematous thin papules/macules with gritty scale.   scalp and arms x7 (7) Erythematous keratotic or waxy stuck-on papule or plaque.   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 04/08/2021. EDC. - No evidence of recurrence today - Recommend regular full body skin exams - Recommend daily broad spectrum sunscreen SPF 30+ to sun-exposed areas, reapply every 2 hours as needed.  - Call if any new or changing lesions are noted between office visits   Lentigines - Scattered tan macules - Due to sun exposure - Benign-appearing, observe - Recommend daily broad spectrum sunscreen SPF 30+ to sun-exposed areas, reapply every 2 hours as needed. - Call for any changes  Seborrheic Keratoses - Stuck-on, waxy, tan-brown papules and/or plaques  - Benign-appearing -  Discussed benign etiology and prognosis. - Observe - Call for any changes  Melanocytic Nevi - Tan-brown and/or pink-flesh-colored symmetric macules and papules - Benign appearing on exam today - Observation - Call clinic for new or changing moles - Recommend daily use of broad spectrum spf 30+ sunscreen to sun-exposed areas.   Hemangiomas - Red papules - Discussed benign nature - Observe - Call for any changes  Actinic Damage - Chronic condition, secondary to cumulative UV/sun exposure - diffuse scaly erythematous macules with underlying dyspigmentation - Recommend daily broad spectrum sunscreen SPF 30+ to sun-exposed areas, reapply every 2 hours as needed.  - Staying in the shade or wearing long sleeves, sun glasses (UVA+UVB protection) and wide brim hats (4-inch brim around the entire circumference of the hat) are also recommended for sun protection.  - Call for new or changing lesions.  Skin cancer screening performed today.  AK (actinic keratosis) right temple x1  Actinic keratoses are precancerous spots that appear secondary to cumulative UV radiation exposure/sun exposure over time. They are chronic with expected duration over 1 year. A portion of actinic keratoses will progress to squamous cell carcinoma of the skin. It is not possible to reliably predict which spots will progress to skin cancer and so treatment is recommended to prevent development of skin cancer.  Recommend daily broad spectrum sunscreen SPF 30+ to sun-exposed areas, reapply every 2 hours as needed.  Recommend staying in the shade or wearing long sleeves, sun glasses (UVA+UVB protection) and wide brim hats (4-inch brim around the entire circumference of the hat). Call for new or  changing lesions.  Destruction of lesion - right temple x1 Complexity: simple   Destruction method: cryotherapy   Informed consent: discussed and consent obtained   Timeout:  patient name, date of birth, surgical site, and  procedure verified Lesion destroyed using liquid nitrogen: Yes   Region frozen until ice ball extended beyond lesion: Yes   Outcome: patient tolerated procedure well with no complications   Post-procedure details: wound care instructions given   Additional details:  Prior to procedure, discussed risks of blister formation, small wound, skin dyspigmentation, or rare scar following cryotherapy. Recommend Vaseline ointment to treated areas while healing.   Inflamed seborrheic keratosis (7) scalp and arms x7  Symptomatic, irritating, patient would like treated.  Destruction of lesion - scalp and arms x7 Complexity: simple   Destruction method: cryotherapy   Informed consent: discussed and consent obtained   Timeout:  patient name, date of birth, surgical site, and procedure verified Lesion destroyed using liquid nitrogen: Yes   Region frozen until ice ball extended beyond lesion: Yes   Outcome: patient tolerated procedure well with no complications   Post-procedure details: wound care instructions given   Additional details:  Prior to procedure, discussed risks of blister formation, small wound, skin dyspigmentation, or rare scar following cryotherapy. Recommend Vaseline ointment to treated areas while healing.   Skin cancer screening  Actinic skin damage  Lentigo  Melanocytic nevus, unspecified location  Seborrheic keratosis  History of basal cell carcinoma   Return in about 1 year (around 04/13/2023) for TBSE, HxBCC.  I, Emelia Salisbury, CMA, am acting as scribe for Sarina Ser, MD. Documentation: I have reviewed the above documentation for accuracy and completeness, and I agree with the above.  Sarina Ser, MD

## 2022-04-14 ENCOUNTER — Encounter: Payer: Self-pay | Admitting: Dermatology

## 2022-04-25 DIAGNOSIS — R030 Elevated blood-pressure reading, without diagnosis of hypertension: Secondary | ICD-10-CM | POA: Diagnosis not present

## 2022-04-25 DIAGNOSIS — N529 Male erectile dysfunction, unspecified: Secondary | ICD-10-CM | POA: Diagnosis not present

## 2022-04-25 DIAGNOSIS — M5430 Sciatica, unspecified side: Secondary | ICD-10-CM | POA: Diagnosis not present

## 2022-04-25 DIAGNOSIS — Z85828 Personal history of other malignant neoplasm of skin: Secondary | ICD-10-CM | POA: Diagnosis not present

## 2022-04-25 DIAGNOSIS — Z9109 Other allergy status, other than to drugs and biological substances: Secondary | ICD-10-CM | POA: Diagnosis not present

## 2022-04-25 DIAGNOSIS — R32 Unspecified urinary incontinence: Secondary | ICD-10-CM | POA: Diagnosis not present

## 2022-04-25 DIAGNOSIS — Z6839 Body mass index (BMI) 39.0-39.9, adult: Secondary | ICD-10-CM | POA: Diagnosis not present

## 2022-04-25 DIAGNOSIS — I739 Peripheral vascular disease, unspecified: Secondary | ICD-10-CM | POA: Diagnosis not present

## 2022-04-25 DIAGNOSIS — M1990 Unspecified osteoarthritis, unspecified site: Secondary | ICD-10-CM | POA: Diagnosis not present

## 2022-04-25 DIAGNOSIS — Z9181 History of falling: Secondary | ICD-10-CM | POA: Diagnosis not present

## 2022-04-25 DIAGNOSIS — Z008 Encounter for other general examination: Secondary | ICD-10-CM | POA: Diagnosis not present

## 2022-04-25 DIAGNOSIS — Z833 Family history of diabetes mellitus: Secondary | ICD-10-CM | POA: Diagnosis not present

## 2022-05-07 DIAGNOSIS — M9905 Segmental and somatic dysfunction of pelvic region: Secondary | ICD-10-CM | POA: Diagnosis not present

## 2022-05-07 DIAGNOSIS — M4306 Spondylolysis, lumbar region: Secondary | ICD-10-CM | POA: Diagnosis not present

## 2022-05-07 DIAGNOSIS — M5432 Sciatica, left side: Secondary | ICD-10-CM | POA: Diagnosis not present

## 2022-05-07 DIAGNOSIS — M9903 Segmental and somatic dysfunction of lumbar region: Secondary | ICD-10-CM | POA: Diagnosis not present

## 2022-06-04 DIAGNOSIS — M9905 Segmental and somatic dysfunction of pelvic region: Secondary | ICD-10-CM | POA: Diagnosis not present

## 2022-06-04 DIAGNOSIS — M5432 Sciatica, left side: Secondary | ICD-10-CM | POA: Diagnosis not present

## 2022-06-04 DIAGNOSIS — M4306 Spondylolysis, lumbar region: Secondary | ICD-10-CM | POA: Diagnosis not present

## 2022-06-04 DIAGNOSIS — M9903 Segmental and somatic dysfunction of lumbar region: Secondary | ICD-10-CM | POA: Diagnosis not present

## 2022-07-09 DIAGNOSIS — M9905 Segmental and somatic dysfunction of pelvic region: Secondary | ICD-10-CM | POA: Diagnosis not present

## 2022-07-09 DIAGNOSIS — M5432 Sciatica, left side: Secondary | ICD-10-CM | POA: Diagnosis not present

## 2022-07-09 DIAGNOSIS — M9903 Segmental and somatic dysfunction of lumbar region: Secondary | ICD-10-CM | POA: Diagnosis not present

## 2022-07-09 DIAGNOSIS — M4306 Spondylolysis, lumbar region: Secondary | ICD-10-CM | POA: Diagnosis not present

## 2022-08-13 DIAGNOSIS — M5432 Sciatica, left side: Secondary | ICD-10-CM | POA: Diagnosis not present

## 2022-08-13 DIAGNOSIS — M4306 Spondylolysis, lumbar region: Secondary | ICD-10-CM | POA: Diagnosis not present

## 2022-08-13 DIAGNOSIS — M9903 Segmental and somatic dysfunction of lumbar region: Secondary | ICD-10-CM | POA: Diagnosis not present

## 2022-08-13 DIAGNOSIS — M9905 Segmental and somatic dysfunction of pelvic region: Secondary | ICD-10-CM | POA: Diagnosis not present

## 2022-09-17 DIAGNOSIS — M9905 Segmental and somatic dysfunction of pelvic region: Secondary | ICD-10-CM | POA: Diagnosis not present

## 2022-09-17 DIAGNOSIS — M5432 Sciatica, left side: Secondary | ICD-10-CM | POA: Diagnosis not present

## 2022-09-17 DIAGNOSIS — M4306 Spondylolysis, lumbar region: Secondary | ICD-10-CM | POA: Diagnosis not present

## 2022-09-17 DIAGNOSIS — M9903 Segmental and somatic dysfunction of lumbar region: Secondary | ICD-10-CM | POA: Diagnosis not present

## 2022-10-15 DIAGNOSIS — M9905 Segmental and somatic dysfunction of pelvic region: Secondary | ICD-10-CM | POA: Diagnosis not present

## 2022-10-15 DIAGNOSIS — M9903 Segmental and somatic dysfunction of lumbar region: Secondary | ICD-10-CM | POA: Diagnosis not present

## 2022-10-15 DIAGNOSIS — M5432 Sciatica, left side: Secondary | ICD-10-CM | POA: Diagnosis not present

## 2022-10-15 DIAGNOSIS — M4306 Spondylolysis, lumbar region: Secondary | ICD-10-CM | POA: Diagnosis not present

## 2022-11-13 DIAGNOSIS — H5203 Hypermetropia, bilateral: Secondary | ICD-10-CM | POA: Diagnosis not present

## 2022-11-13 DIAGNOSIS — H524 Presbyopia: Secondary | ICD-10-CM | POA: Diagnosis not present

## 2022-11-13 DIAGNOSIS — H25813 Combined forms of age-related cataract, bilateral: Secondary | ICD-10-CM | POA: Diagnosis not present

## 2022-11-13 DIAGNOSIS — H5212 Myopia, left eye: Secondary | ICD-10-CM | POA: Diagnosis not present

## 2022-11-13 DIAGNOSIS — H52223 Regular astigmatism, bilateral: Secondary | ICD-10-CM | POA: Diagnosis not present

## 2022-11-19 DIAGNOSIS — M9905 Segmental and somatic dysfunction of pelvic region: Secondary | ICD-10-CM | POA: Diagnosis not present

## 2022-11-19 DIAGNOSIS — M9903 Segmental and somatic dysfunction of lumbar region: Secondary | ICD-10-CM | POA: Diagnosis not present

## 2022-11-19 DIAGNOSIS — M5432 Sciatica, left side: Secondary | ICD-10-CM | POA: Diagnosis not present

## 2022-11-19 DIAGNOSIS — M4306 Spondylolysis, lumbar region: Secondary | ICD-10-CM | POA: Diagnosis not present

## 2022-12-24 DIAGNOSIS — M9903 Segmental and somatic dysfunction of lumbar region: Secondary | ICD-10-CM | POA: Diagnosis not present

## 2022-12-24 DIAGNOSIS — M4306 Spondylolysis, lumbar region: Secondary | ICD-10-CM | POA: Diagnosis not present

## 2022-12-24 DIAGNOSIS — M5432 Sciatica, left side: Secondary | ICD-10-CM | POA: Diagnosis not present

## 2022-12-24 DIAGNOSIS — M9905 Segmental and somatic dysfunction of pelvic region: Secondary | ICD-10-CM | POA: Diagnosis not present

## 2023-01-28 DIAGNOSIS — M9905 Segmental and somatic dysfunction of pelvic region: Secondary | ICD-10-CM | POA: Diagnosis not present

## 2023-01-28 DIAGNOSIS — M4306 Spondylolysis, lumbar region: Secondary | ICD-10-CM | POA: Diagnosis not present

## 2023-01-28 DIAGNOSIS — M5432 Sciatica, left side: Secondary | ICD-10-CM | POA: Diagnosis not present

## 2023-01-28 DIAGNOSIS — M9903 Segmental and somatic dysfunction of lumbar region: Secondary | ICD-10-CM | POA: Diagnosis not present

## 2023-03-04 DIAGNOSIS — M9903 Segmental and somatic dysfunction of lumbar region: Secondary | ICD-10-CM | POA: Diagnosis not present

## 2023-03-04 DIAGNOSIS — M5432 Sciatica, left side: Secondary | ICD-10-CM | POA: Diagnosis not present

## 2023-03-04 DIAGNOSIS — M4306 Spondylolysis, lumbar region: Secondary | ICD-10-CM | POA: Diagnosis not present

## 2023-03-04 DIAGNOSIS — M9905 Segmental and somatic dysfunction of pelvic region: Secondary | ICD-10-CM | POA: Diagnosis not present

## 2023-04-08 DIAGNOSIS — M4306 Spondylolysis, lumbar region: Secondary | ICD-10-CM | POA: Diagnosis not present

## 2023-04-08 DIAGNOSIS — M5432 Sciatica, left side: Secondary | ICD-10-CM | POA: Diagnosis not present

## 2023-04-08 DIAGNOSIS — M9905 Segmental and somatic dysfunction of pelvic region: Secondary | ICD-10-CM | POA: Diagnosis not present

## 2023-04-08 DIAGNOSIS — M9903 Segmental and somatic dysfunction of lumbar region: Secondary | ICD-10-CM | POA: Diagnosis not present

## 2023-04-14 ENCOUNTER — Ambulatory Visit: Payer: Medicare HMO | Admitting: Dermatology

## 2023-05-20 DIAGNOSIS — M5432 Sciatica, left side: Secondary | ICD-10-CM | POA: Diagnosis not present

## 2023-05-20 DIAGNOSIS — M4306 Spondylolysis, lumbar region: Secondary | ICD-10-CM | POA: Diagnosis not present

## 2023-05-20 DIAGNOSIS — M9903 Segmental and somatic dysfunction of lumbar region: Secondary | ICD-10-CM | POA: Diagnosis not present

## 2023-05-20 DIAGNOSIS — M9905 Segmental and somatic dysfunction of pelvic region: Secondary | ICD-10-CM | POA: Diagnosis not present

## 2023-06-24 DIAGNOSIS — M9903 Segmental and somatic dysfunction of lumbar region: Secondary | ICD-10-CM | POA: Diagnosis not present

## 2023-06-24 DIAGNOSIS — M9905 Segmental and somatic dysfunction of pelvic region: Secondary | ICD-10-CM | POA: Diagnosis not present

## 2023-06-24 DIAGNOSIS — M4306 Spondylolysis, lumbar region: Secondary | ICD-10-CM | POA: Diagnosis not present

## 2023-06-24 DIAGNOSIS — M5432 Sciatica, left side: Secondary | ICD-10-CM | POA: Diagnosis not present

## 2023-07-05 ENCOUNTER — Ambulatory Visit: Payer: Medicare HMO | Admitting: Dermatology

## 2023-07-05 ENCOUNTER — Encounter: Payer: Self-pay | Admitting: Dermatology

## 2023-07-05 DIAGNOSIS — Z85828 Personal history of other malignant neoplasm of skin: Secondary | ICD-10-CM

## 2023-07-05 DIAGNOSIS — L738 Other specified follicular disorders: Secondary | ICD-10-CM | POA: Diagnosis not present

## 2023-07-05 DIAGNOSIS — D692 Other nonthrombocytopenic purpura: Secondary | ICD-10-CM

## 2023-07-05 DIAGNOSIS — L729 Follicular cyst of the skin and subcutaneous tissue, unspecified: Secondary | ICD-10-CM

## 2023-07-05 DIAGNOSIS — D1801 Hemangioma of skin and subcutaneous tissue: Secondary | ICD-10-CM | POA: Diagnosis not present

## 2023-07-05 DIAGNOSIS — W908XXA Exposure to other nonionizing radiation, initial encounter: Secondary | ICD-10-CM

## 2023-07-05 DIAGNOSIS — L578 Other skin changes due to chronic exposure to nonionizing radiation: Secondary | ICD-10-CM

## 2023-07-05 DIAGNOSIS — L570 Actinic keratosis: Secondary | ICD-10-CM | POA: Diagnosis not present

## 2023-07-05 DIAGNOSIS — L918 Other hypertrophic disorders of the skin: Secondary | ICD-10-CM | POA: Diagnosis not present

## 2023-07-05 DIAGNOSIS — Z1283 Encounter for screening for malignant neoplasm of skin: Secondary | ICD-10-CM

## 2023-07-05 DIAGNOSIS — L720 Epidermal cyst: Secondary | ICD-10-CM | POA: Diagnosis not present

## 2023-07-05 DIAGNOSIS — L814 Other melanin hyperpigmentation: Secondary | ICD-10-CM

## 2023-07-05 DIAGNOSIS — L821 Other seborrheic keratosis: Secondary | ICD-10-CM | POA: Diagnosis not present

## 2023-07-05 DIAGNOSIS — D229 Melanocytic nevi, unspecified: Secondary | ICD-10-CM

## 2023-07-05 NOTE — Patient Instructions (Addendum)

## 2023-07-05 NOTE — Progress Notes (Signed)
 Follow-Up Visit   Subjective  Troy Gardner is a 83 y.o. male who presents for the following: Skin Cancer Screening and Upper Body Skin Exam hx of BCC, Aks, check spot L arm  The patient presents for Upper Body Skin Exam (UBSE) for skin cancer screening and mole check. The patient has spots, moles and lesions to be evaluated, some may be new or changing and the patient may have concern these could be cancer.  The following portions of the chart were reviewed this encounter and updated as appropriate: medications, allergies, medical history  Review of Systems:  No other skin or systemic complaints except as noted in HPI or Assessment and Plan.  Objective  Well appearing patient in no apparent distress; mood and affect are within normal limits.  All skin waist up examined. Relevant physical exam findings are noted in the Assessment and Plan.  Scalp x 2, R ear x 3, nose x 1, arms x 15 (21) Pink scaly macules  Assessment & Plan   AK (ACTINIC KERATOSIS) (21) Scalp x 2, R ear x 3, nose x 1, arms x 15 (21) Actinic keratoses are precancerous spots that appear secondary to cumulative UV radiation exposure/sun exposure over time. They are chronic with expected duration over 1 year. A portion of actinic keratoses will progress to squamous cell carcinoma of the skin. It is not possible to reliably predict which spots will progress to skin cancer and so treatment is recommended to prevent development of skin cancer.  Recommend daily broad spectrum sunscreen SPF 30+ to sun-exposed areas, reapply every 2 hours as needed.  Recommend staying in the shade or wearing long sleeves, sun glasses (UVA+UVB protection) and wide brim hats (4-inch brim around the entire circumference of the hat). Call for new or changing lesions. Destruction of lesion - Scalp x 2, R ear x 3, nose x 1, arms x 15 (21) Complexity: simple   Destruction method: cryotherapy   Informed consent: discussed and consent obtained    Timeout:  patient name, date of birth, surgical site, and procedure verified Lesion destroyed using liquid nitrogen: Yes   Region frozen until ice ball extended beyond lesion: Yes   Outcome: patient tolerated procedure well with no complications   Post-procedure details: wound care instructions given   PURPURA (HCC)   SKIN CANCER SCREENING   ACTINIC SKIN DAMAGE   LENTIGO   MELANOCYTIC NEVUS, UNSPECIFIED LOCATION   CYST OF SKIN   HISTORY OF BASAL CELL CARCINOMA   SKIN TAGS, MULTIPLE ACQUIRED   Skin cancer screening performed today.  Actinic Damage - Chronic condition, secondary to cumulative UV/sun exposure - diffuse scaly erythematous macules with underlying dyspigmentation - Recommend daily broad spectrum sunscreen SPF 30+ to sun-exposed areas, reapply every 2 hours as needed.  - Staying in the shade or wearing long sleeves, sun glasses (UVA+UVB protection) and wide brim hats (4-inch brim around the entire circumference of the hat) are also recommended for sun protection.  - Call for new or changing lesions.  Lentigines, Seborrheic Keratoses, Hemangiomas - Benign normal skin lesions - Benign-appearing - Call for any changes  Melanocytic Nevi - Tan-brown and/or pink-flesh-colored symmetric macules and papules - Benign appearing on exam today - Observation - Call clinic for new or changing moles - Recommend daily use of broad spectrum spf 30+ sunscreen to sun-exposed areas.   HISTORY OF BASAL CELL CARCINOMA OF THE SKIN - No evidence of recurrence today - Recommend regular full body skin exams - Recommend daily broad  spectrum sunscreen SPF 30+ to sun-exposed areas, reapply every 2 hours as needed.  - Call if any new or changing lesions are noted between office visits  - R lat neck inf post auricular  Milia forehead - tiny firm white papules - type of cyst - benign - sometimes these will clear with nightly OTC adapalene/Differin 0.1% gel or retinol. - may  be extracted if symptomatic - observe  Sebaceous Hyperplasia forehead - Small yellow papules with a central dell - Benign-appearing - Observe. Call for changes.   Purpura - Chronic; persistent and recurrent.  Treatable, but not curable. - Violaceous macules and patches - Benign - Related to trauma, age, sun damage and/or use of blood thinners, chronic use of topical and/or oral steroids - Observe - Can use OTC arnica containing moisturizer such as Dermend Bruise Formula if desired - Call for worsening or other concerns  Acrochordons (Skin Tags) Axilla  - Fleshy, skin-colored pedunculated papules - Benign appearing.  - Observe. - If desired, they can be removed with an in office procedure that is not covered by insurance. - Please call the clinic if you notice any new or changing lesions.   Return in about 1 year (around 07/04/2024) for UBSE, Hx of BCC, Hx of AKs.  I, Rollie Clipper, RMA, am acting as scribe for Celine Collard, MD .   Documentation: I have reviewed the above documentation for accuracy and completeness, and I agree with the above.  Celine Collard, MD

## 2023-07-29 DIAGNOSIS — M9903 Segmental and somatic dysfunction of lumbar region: Secondary | ICD-10-CM | POA: Diagnosis not present

## 2023-07-29 DIAGNOSIS — M5432 Sciatica, left side: Secondary | ICD-10-CM | POA: Diagnosis not present

## 2023-07-29 DIAGNOSIS — M4306 Spondylolysis, lumbar region: Secondary | ICD-10-CM | POA: Diagnosis not present

## 2023-07-29 DIAGNOSIS — M9905 Segmental and somatic dysfunction of pelvic region: Secondary | ICD-10-CM | POA: Diagnosis not present

## 2023-08-22 DIAGNOSIS — H2511 Age-related nuclear cataract, right eye: Secondary | ICD-10-CM | POA: Diagnosis not present

## 2023-08-22 DIAGNOSIS — H2512 Age-related nuclear cataract, left eye: Secondary | ICD-10-CM | POA: Diagnosis not present

## 2023-08-22 DIAGNOSIS — H35361 Drusen (degenerative) of macula, right eye: Secondary | ICD-10-CM | POA: Diagnosis not present

## 2023-09-01 ENCOUNTER — Encounter: Payer: Self-pay | Admitting: Ophthalmology

## 2023-09-02 DIAGNOSIS — M9903 Segmental and somatic dysfunction of lumbar region: Secondary | ICD-10-CM | POA: Diagnosis not present

## 2023-09-02 DIAGNOSIS — M5432 Sciatica, left side: Secondary | ICD-10-CM | POA: Diagnosis not present

## 2023-09-02 DIAGNOSIS — M4306 Spondylolysis, lumbar region: Secondary | ICD-10-CM | POA: Diagnosis not present

## 2023-09-02 DIAGNOSIS — M9905 Segmental and somatic dysfunction of pelvic region: Secondary | ICD-10-CM | POA: Diagnosis not present

## 2023-09-13 NOTE — Discharge Instructions (Signed)

## 2023-09-14 ENCOUNTER — Encounter: Payer: Self-pay | Admitting: Ophthalmology

## 2023-09-14 NOTE — Anesthesia Preprocedure Evaluation (Signed)
 Anesthesia Evaluation  Patient identified by MRN, date of birth, ID band Patient awake    Reviewed: Allergy & Precautions, H&P , NPO status , Patient's Chart, lab work & pertinent test results  Airway Mallampati: I  TM Distance: >3 FB Neck ROM: Full    Dental no notable dental hx. (+) Poor Dentition, Edentulous Lower   Pulmonary neg pulmonary ROS   Pulmonary exam normal breath sounds clear to auscultation       Cardiovascular negative cardio ROS Normal cardiovascular exam Rhythm:Regular Rate:Normal     Neuro/Psych negative neurological ROS  negative psych ROS   GI/Hepatic negative GI ROS, Neg liver ROS,GERD  ,,  Endo/Other  negative endocrine ROS    Renal/GU negative Renal ROS  negative genitourinary   Musculoskeletal negative musculoskeletal ROS (+) Arthritis ,    Abdominal   Peds negative pediatric ROS (+)  Hematology negative hematology ROS (+)   Anesthesia Other Findings Hx of actinic keratosis  Parkinson's disease (HCC) GERD (gastroesophageal reflux disease) History of methicillin resistant staphylococcus aureus (MRSA) Basal cell carcinoma  Arthritis  He is the nicest, most relaxed and happy person.    Reproductive/Obstetrics negative OB ROS                              Anesthesia Physical Anesthesia Plan  ASA: 2  Anesthesia Plan: MAC   Post-op Pain Management:    Induction: Intravenous  PONV Risk Score and Plan:   Airway Management Planned: Natural Airway and Nasal Cannula  Additional Equipment:   Intra-op Plan:   Post-operative Plan:   Informed Consent: I have reviewed the patients History and Physical, chart, labs and discussed the procedure including the risks, benefits and alternatives for the proposed anesthesia with the patient or authorized representative who has indicated his/her understanding and acceptance.     Dental Advisory Given  Plan Discussed  with: Anesthesiologist, CRNA and Surgeon  Anesthesia Plan Comments: (Patient consented for risks of anesthesia including but not limited to:  - adverse reactions to medications - damage to eyes, teeth, lips or other oral mucosa - nerve damage due to positioning  - sore throat or hoarseness - Damage to heart, brain, nerves, lungs, other parts of body or loss of life  Patient voiced understanding and assent.)         Anesthesia Quick Evaluation

## 2023-09-15 ENCOUNTER — Ambulatory Visit: Payer: Self-pay | Admitting: Anesthesiology

## 2023-09-15 ENCOUNTER — Encounter: Payer: Self-pay | Admitting: Ophthalmology

## 2023-09-15 ENCOUNTER — Ambulatory Visit
Admission: RE | Admit: 2023-09-15 | Discharge: 2023-09-15 | Disposition: A | Discharge status: Home / Self Care | Attending: Ophthalmology | Admitting: Ophthalmology

## 2023-09-15 ENCOUNTER — Encounter
Admission: RE | Disposition: A | Discharge status: Home / Self Care | Payer: Self-pay | Source: Home / Self Care | Attending: Ophthalmology

## 2023-09-15 ENCOUNTER — Other Ambulatory Visit: Payer: Self-pay

## 2023-09-15 DIAGNOSIS — H2512 Age-related nuclear cataract, left eye: Secondary | ICD-10-CM | POA: Diagnosis not present

## 2023-09-15 DIAGNOSIS — Z87891 Personal history of nicotine dependence: Secondary | ICD-10-CM | POA: Diagnosis not present

## 2023-09-15 DIAGNOSIS — K219 Gastro-esophageal reflux disease without esophagitis: Secondary | ICD-10-CM | POA: Insufficient documentation

## 2023-09-15 DIAGNOSIS — M1990 Unspecified osteoarthritis, unspecified site: Secondary | ICD-10-CM | POA: Diagnosis not present

## 2023-09-15 HISTORY — DX: Other forms of dyspnea: R06.09

## 2023-09-15 HISTORY — PX: CATARACT EXTRACTION W/PHACO: SHX586

## 2023-09-15 HISTORY — DX: Unspecified osteoarthritis, unspecified site: M19.90

## 2023-09-15 HISTORY — DX: Elevated blood-pressure reading, without diagnosis of hypertension: R03.0

## 2023-09-15 SURGERY — PHACOEMULSIFICATION, CATARACT, WITH IOL INSERTION
Anesthesia: Monitor Anesthesia Care | Site: Eye | Laterality: Left

## 2023-09-15 MED ORDER — MIDAZOLAM HCL 2 MG/2ML IJ SOLN
INTRAMUSCULAR | Status: AC
  Filled 2023-09-15: qty 2

## 2023-09-15 MED ORDER — TETRACAINE HCL 0.5 % OP SOLN
1.0000 [drp] | OPHTHALMIC | Status: AC | PRN
  Administered 2023-09-15 (×3): 1 [drp] via OPHTHALMIC

## 2023-09-15 MED ORDER — MOXIFLOXACIN HCL 0.5 % OP SOLN
OPHTHALMIC | Status: DC | PRN
  Administered 2023-09-15: .2 mL via OPHTHALMIC

## 2023-09-15 MED ORDER — BRIMONIDINE TARTRATE-TIMOLOL 0.2-0.5 % OP SOLN
OPHTHALMIC | Status: DC | PRN
  Administered 2023-09-15: 1 [drp] via OPHTHALMIC

## 2023-09-15 MED ORDER — LACTATED RINGERS IV SOLN: INTRAVENOUS | Status: DC

## 2023-09-15 MED ORDER — FENTANYL CITRATE (PF) 100 MCG/2ML IJ SOLN
INTRAMUSCULAR | Status: DC | PRN
  Administered 2023-09-15: 50 ug via INTRAVENOUS

## 2023-09-15 MED ORDER — ARMC OPHTHALMIC DILATING DROPS
OPHTHALMIC | Status: AC
  Filled 2023-09-15: qty 0.5

## 2023-09-15 MED ORDER — TETRACAINE HCL 0.5 % OP SOLN
OPHTHALMIC | Status: AC
  Filled 2023-09-15: qty 4

## 2023-09-15 MED ORDER — FENTANYL CITRATE (PF) 100 MCG/2ML IJ SOLN
INTRAMUSCULAR | Status: AC
  Filled 2023-09-15: qty 2

## 2023-09-15 MED ORDER — ARMC OPHTHALMIC DILATING DROPS
1.0000 | OPHTHALMIC | Status: AC | PRN
  Administered 2023-09-15 (×3): 1 via OPHTHALMIC

## 2023-09-15 MED ORDER — PHENYLEPHRINE-KETOROLAC 1-0.3 % IO SOLN
INTRAOCULAR | Status: DC | PRN
  Administered 2023-09-15: 94 mL via OPHTHALMIC

## 2023-09-15 MED ORDER — MIDAZOLAM HCL 5 MG/5ML IJ SOLN
INTRAMUSCULAR | Status: DC | PRN
Start: 2023-09-15 — End: 2023-09-15
  Administered 2023-09-15: 1 mg via INTRAVENOUS

## 2023-09-15 MED ORDER — SIGHTPATH DOSE#1 BSS IO SOLN
INTRAOCULAR | Status: DC | PRN
  Administered 2023-09-15: 15 mL via INTRAOCULAR

## 2023-09-15 MED ORDER — LIDOCAINE HCL (PF) 2 % IJ SOLN
INTRAOCULAR | Status: DC | PRN
  Administered 2023-09-15: 4 mL via INTRAOCULAR

## 2023-09-15 MED ORDER — SIGHTPATH DOSE#1 NA HYALUR & NA CHOND-NA HYALUR IO KIT
PACK | INTRAOCULAR | Status: DC | PRN
  Administered 2023-09-15: 1 via OPHTHALMIC

## 2023-09-15 SURGICAL SUPPLY — 11 items
CATARACT SUITE SIGHTPATH (MISCELLANEOUS) ×1 IMPLANT
DISSECTOR HYDRO NUCLEUS 50X22 (MISCELLANEOUS) ×1 IMPLANT
DRSG TEGADERM 2-3/8X2-3/4 SM (GAUZE/BANDAGES/DRESSINGS) ×1 IMPLANT
FEE CATARACT SUITE SIGHTPATH (MISCELLANEOUS) ×1 IMPLANT
GLOVE BIOGEL PI IND STRL 8 (GLOVE) ×1 IMPLANT
GLOVE SURG LX STRL 7.5 STRW (GLOVE) ×1 IMPLANT
GLOVE SURG SYN 6.5 PF PI BL (GLOVE) ×1 IMPLANT
LENS IOL CLRN PAN PRO TRC 19.5 IMPLANT
NDL FILTER BLUNT 18X1 1/2 (NEEDLE) ×1 IMPLANT
NEEDLE FILTER BLUNT 18X1 1/2 (NEEDLE) ×1 IMPLANT
SYR 3ML LL SCALE MARK (SYRINGE) ×1 IMPLANT

## 2023-09-15 NOTE — H&P (Signed)
 [facility]   Primary Care Physician:  [place] HERO, MD Ophthalmologist: Dr. Feliciano Ober  Pre-Procedure History & Physical: HPI:  Troy Gardner is a 83 y.o. male here for cataract surgery.   Past Medical History:  Diagnosis Date   Arthritis    Basal cell carcinoma 04/08/2021   right lat neck inferior post auricular, EDC   Borderline hypertension    Exertional dyspnea    GERD (gastroesophageal reflux disease)    h/o no meds   History of methicillin resistant staphylococcus aureus (MRSA)    YEARS AGO   Hx of actinic keratosis    Parkinson's disease (HCC)    PT STATES HES NOT SURE IF HAS PARKINSON'S-HE TOLD HIS PCP HE HAS OCCASIONAL HAND TREMORS AND PER PT, PCP TOLD HIM HE HAD PARKINSON'S-NO MEDS    Past Surgical History:  Procedure Laterality Date   ANKLE SURGERY  2010   COLONOSCOPY     UMBILICAL HERNIA REPAIR N/A 06/20/2019   Procedure: HERNIA REPAIR UMBILICAL ADULT, Open;  Surgeon: Marolyn Nest, MD;  Location: [facility] ORS;  Service: General;  Laterality: N/A;    Prior to Admission medications   Medication Sig Start Date End Date Taking? Authorizing Provider  Cholecalciferol (D3 2000 PO) Take by mouth daily.   Yes [provider]  HYDROcodone -acetaminophen  (NORCO/VICODIN) 5-325 MG tablet Take 1 tablet by mouth every 6 (six) hours as needed for moderate pain. Patient not taking: Reported on 07/10/2019 06/20/19   Marolyn Nest, MD  ibuprofen  (ADVIL ) 800 MG tablet Take 1 tablet (800 mg total) by mouth every 8 (eight) hours as needed. 06/20/19   Marolyn Nest, MD  Multiple Vitamin (MULTIVITAMIN) tablet Take 5 tablets by mouth daily. [facility] MVI Patient not taking: Reported on 09/01/2023    [provider]    Allergies as of 08/24/2023 - Review Complete 07/05/2023  Allergen Reaction Noted   Tape Rash 06/14/2019    Family History  Problem Relation Age of Onset   Skin cancer Father    Diabetes Mother    Glaucoma Mother    Congestive Heart  Failure Mother     Social History   Socioeconomic History   Marital status: Married    Spouse name: Sherron   Number of children: Not on file   Years of education: Not on file   Highest education level: Not on file  Occupational History   Not on file  Tobacco Use   Smoking status: Never   Smokeless tobacco: Former    Types: Engineer, drilling   Vaping status: Never Used  Substance and Sexual Activity   Alcohol use: Never   Drug use: Never   Sexual activity: Not on file  Other Topics Concern   Not on file  Social History Narrative   Not on file   Social Drivers of Health   Financial Resource Strain: Not on file  Food Insecurity: Not on file  Transportation Needs: Not on file  Physical Activity: Not on file  Stress: Not on file  Social Connections: Not on file  Intimate Partner Violence: Not on file    Review of Systems: See HPI, otherwise negative ROS  Physical Exam: Ht 5' 9 (1.753 m)   Wt 115.2 kg   BMI 37.51 kg/m  General:   Alert, cooperative in NAD Head:  Normocephalic and atraumatic. Respiratory:  Normal work of breathing. Cardiovascular:  RRR  Impression/Plan: Troy Gardner is here for cataract surgery.  Risks, benefits, limitations, and alternatives regarding cataract  surgery have been reviewed with the patient.  Questions have been answered.  All parties agreeable.   Feliciano Bryan Ober, MD  09/15/2023, 7:07 AM

## 2023-09-15 NOTE — Anesthesia Postprocedure Evaluation (Signed)
 Anesthesia Post Note  Patient: Troy Gardner  Procedure(s) Performed: PHACOEMULSIFICATION, CATARACT, WITH IOL INSERTION 15.01 01:18.6 (Left: Eye)  Patient location during evaluation: PACU Anesthesia Type: MAC Level of consciousness: awake and alert Pain management: pain level controlled Vital Signs Assessment: post-procedure vital signs reviewed and stable Respiratory status: spontaneous breathing, nonlabored ventilation, respiratory function stable and patient connected to nasal cannula oxygen Cardiovascular status: stable and blood pressure returned to baseline Postop Assessment: no apparent nausea or vomiting Anesthetic complications: no   No notable events documented.   Last Vitals:  Vitals:   09/15/23 0853 09/15/23 0857  BP: 139/74 133/77  Pulse: 69 (!) 58  Resp: (!) 25 14  Temp: (!) 36.3 C (!) 36.3 C  SpO2: 96% 95%    Last Pain:  Vitals:   09/15/23 0857  TempSrc:   PainSc: 0-No pain                 Donny JAYSON Mu

## 2023-09-15 NOTE — Transfer of Care (Signed)
 Immediate Anesthesia Transfer of Care Note  Patient: Troy Gardner  Procedure(s) Performed: PHACOEMULSIFICATION, CATARACT, WITH IOL INSERTION 15.01 01:18.6 (Left: Eye)  Patient Location: PACU  Anesthesia Type: MAC  Level of Consciousness: awake, alert  and patient cooperative  Airway and Oxygen Therapy: Patient Spontanous Breathing and Patient connected to supplemental oxygen  Post-op Assessment: Post-op Vital signs reviewed, Patient's Cardiovascular Status Stable, Respiratory Function Stable, Patent Airway and No signs of Nausea or vomiting  Post-op Vital Signs: Reviewed and stable  Complications: No notable events documented.

## 2023-09-15 NOTE — Op Note (Signed)
 OPERATIVE NOTE  Troy Gardner [id number] 09/15/2023   PREOPERATIVE DIAGNOSIS: Nuclear sclerotic cataract left eye. H25.12   POSTOPERATIVE DIAGNOSIS: Nuclear sclerotic cataract left eye. H25.12   PROCEDURE:  Phacoemusification with Toric posterior chamber intraocular lens placement of the left eye  Ultrasound time: Procedure(s): PHACOEMULSIFICATION, CATARACT, WITH IOL INSERTION 15.01 01:18.6 (Left)  LENS:   Implant Name Type Inv. Item Serial No. Manufacturer Lot No. LRB No. Used Action  LENS IOL CLRN PAN PRO TRC 19.5 - [id number]  LENS IOL CLRN PAN PRO TRC 19.5 [id number] [employer]  Left 1 Implanted    [id number] Toric intraocular lens with 1.50 diopters of cylindrical power with axis orientation at 005 degrees.  SURGEON:  [name]. Enola, MD   ANESTHESIA:  Topical with tetracaine  drops, augmented with 1% preservative-free intracameral lidocaine .   COMPLICATIONS:  None.   DESCRIPTION OF PROCEDURE:  The patient was identified in the holding room and transported to the operating room and placed in the supine position under the operating microscope.  The left eye was identified as the operative eye, which was prepped and draped in the usual sterile ophthalmic fashion.   A 1 millimeter clear-corneal paracentesis was made inferotemporally. Preservative-free 1% lidocaine  mixed with 1:1,000 bisulfite-free aqueous solution of epinephrine  was injected into the anterior chamber. The anterior chamber was then filled with Viscoat viscoelastic. A 2.4 millimeter keratome was used to make a clear-corneal incision superotemporally. A curvilinear capsulorrhexis was made with a cystotome and capsulorrhexis forceps. Balanced salt  solution was used to hydrodissect and hydrodelineate the nucleus. Phacoemulsification was then used to remove the lens nucleus and epinucleus. The remaining cortex was then removed using the irrigation and aspiration handpiece. Provisc was then placed into the capsular bag to distend  it for lens placement. The Verion digital marker was used to align the implant at the intended axis.  A +19.50 D [id number] Toric lens was then injected into the capsular bag.  It was rotated clockwise until the axis marks on the lens were approximately 15 degrees in the counterclockwise direction to the intended alignment.  The viscoelastic was aspirated from the eye using the irrigation aspiration handpiece.  Then, a blunt chopper through the sideport incision was used to rotate the lens in a clockwise direction until the axis markings of the intraocular lens were lined up with the Verion alignment.  Balanced salt  solution was then used to hydrate the wounds.   The anterior chamber was inflated to a physiologic pressure with balanced salt  solution.  No wound leaks were noted. Moxifloxacin  was injected intracamerally.  Timolol  and Brimonidine  drops were applied to the eye.  The patient was taken to the recovery room in stable condition without complications of anesthesia or surgery.  [name] 09/15/2023, 8:52 AM

## 2023-09-16 DIAGNOSIS — H2511 Age-related nuclear cataract, right eye: Secondary | ICD-10-CM | POA: Diagnosis not present

## 2023-09-19 NOTE — Anesthesia Preprocedure Evaluation (Addendum)
 Anesthesia Evaluation  Patient identified by MRN, date of birth, ID band Patient awake    Reviewed: Allergy & Precautions, H&P , NPO status , Patient's Chart, lab work & pertinent test results  Airway Mallampati: I  TM Distance: >3 FB Neck ROM: Full    Dental no notable dental hx. (+) Lower Dentures, Poor Dentition  (+) Poor Dentition, Edentulous Lower :   Pulmonary neg pulmonary ROS   Pulmonary exam normal breath sounds clear to auscultation       Cardiovascular negative cardio ROS Normal cardiovascular exam Rhythm:Regular Rate:Normal     Neuro/Psych negative neurological ROS  negative psych ROS   GI/Hepatic negative GI ROS, Neg liver ROS,GERD  ,,  Endo/Other  negative endocrine ROS    Renal/GU negative Renal ROS  negative genitourinary   Musculoskeletal negative musculoskeletal ROS (+) Arthritis ,    Abdominal   Peds negative pediatric ROS (+)  Hematology negative hematology ROS (+)   Anesthesia Other Findings Previous cataract surgery 09-15-23 Dr. Ola   Hx of actinic keratosis Parkinson's disease (HCC) GERD (gastroesophageal reflux disease) History of methicillin resistant staphylococcus aureus (MRSA) Basal cell carcinoma Arthritis Borderline hypertension  Exertional dyspne    Reproductive/Obstetrics negative OB ROS                              Anesthesia Physical Anesthesia Plan  ASA: 2  Anesthesia Plan: MAC   Post-op Pain Management:    Induction: Intravenous  PONV Risk Score and Plan:   Airway Management Planned: Natural Airway and Nasal Cannula  Additional Equipment:   Intra-op Plan:   Post-operative Plan:   Informed Consent: I have reviewed the patients History and Physical, chart, labs and discussed the procedure including the risks, benefits and alternatives for the proposed anesthesia with the patient or authorized representative who has indicated  his/her understanding and acceptance.     Dental Advisory Given  Plan Discussed with: Anesthesiologist, CRNA and Surgeon  Anesthesia Plan Comments: (Patient consented for risks of anesthesia including but not limited to:  - adverse reactions to medications - damage to eyes, teeth, lips or other oral mucosa - nerve damage due to positioning  - sore throat or hoarseness - Damage to heart, brain, nerves, lungs, other parts of body or loss of life  Patient voiced understanding and assent.)         Anesthesia Quick Evaluation

## 2023-09-22 NOTE — Discharge Instructions (Signed)

## 2023-09-29 ENCOUNTER — Encounter: Payer: Self-pay | Admitting: Ophthalmology

## 2023-09-29 ENCOUNTER — Ambulatory Visit: Payer: Self-pay | Admitting: Anesthesiology

## 2023-09-29 ENCOUNTER — Other Ambulatory Visit: Payer: Self-pay

## 2023-09-29 ENCOUNTER — Ambulatory Visit
Admission: RE | Admit: 2023-09-29 | Discharge: 2023-09-29 | Disposition: A | Discharge status: Home / Self Care | Attending: Ophthalmology | Admitting: Ophthalmology

## 2023-09-29 ENCOUNTER — Encounter
Admission: RE | Disposition: A | Discharge status: Home / Self Care | Payer: Self-pay | Source: Home / Self Care | Attending: Ophthalmology

## 2023-09-29 DIAGNOSIS — K219 Gastro-esophageal reflux disease without esophagitis: Secondary | ICD-10-CM | POA: Diagnosis not present

## 2023-09-29 DIAGNOSIS — H2511 Age-related nuclear cataract, right eye: Secondary | ICD-10-CM | POA: Diagnosis not present

## 2023-09-29 DIAGNOSIS — Z87891 Personal history of nicotine dependence: Secondary | ICD-10-CM | POA: Diagnosis not present

## 2023-09-29 HISTORY — PX: CATARACT EXTRACTION W/PHACO: SHX586

## 2023-09-29 SURGERY — PHACOEMULSIFICATION, CATARACT, WITH IOL INSERTION
Anesthesia: Monitor Anesthesia Care | Site: Eye | Laterality: Right

## 2023-09-29 MED ORDER — MIDAZOLAM HCL 2 MG/2ML IJ SOLN
INTRAMUSCULAR | Status: DC | PRN
  Administered 2023-09-29: 2 mg via INTRAVENOUS

## 2023-09-29 MED ORDER — TETRACAINE HCL 0.5 % OP SOLN
OPHTHALMIC | Status: AC
  Filled 2023-09-29: qty 4

## 2023-09-29 MED ORDER — LACTATED RINGERS IV SOLN: INTRAVENOUS | Status: DC

## 2023-09-29 MED ORDER — ARMC OPHTHALMIC DILATING DROPS
1.0000 | OPHTHALMIC | Status: DC | PRN
  Administered 2023-09-29 (×3): 1 via OPHTHALMIC

## 2023-09-29 MED ORDER — MIDAZOLAM HCL 2 MG/2ML IJ SOLN
INTRAMUSCULAR | Status: AC
Start: 2023-09-29 — End: 2023-09-29
  Filled 2023-09-29: qty 2

## 2023-09-29 MED ORDER — PHENYLEPHRINE-KETOROLAC 1-0.3 % IO SOLN
INTRAOCULAR | Status: DC | PRN
  Administered 2023-09-29: 77 mL via OPHTHALMIC

## 2023-09-29 MED ORDER — TETRACAINE HCL 0.5 % OP SOLN
1.0000 [drp] | OPHTHALMIC | Status: DC | PRN
  Administered 2023-09-29 (×3): 1 [drp] via OPHTHALMIC

## 2023-09-29 MED ORDER — FENTANYL CITRATE (PF) 100 MCG/2ML IJ SOLN
INTRAMUSCULAR | Status: AC
  Filled 2023-09-29: qty 2

## 2023-09-29 MED ORDER — SIGHTPATH DOSE#1 BSS IO SOLN
INTRAOCULAR | Status: DC | PRN
  Administered 2023-09-29: 15 mL via INTRAOCULAR

## 2023-09-29 MED ORDER — BRIMONIDINE TARTRATE-TIMOLOL 0.2-0.5 % OP SOLN
OPHTHALMIC | Status: DC | PRN
  Administered 2023-09-29: 1 [drp] via OPHTHALMIC

## 2023-09-29 MED ORDER — LIDOCAINE HCL (PF) 2 % IJ SOLN
INTRAOCULAR | Status: DC | PRN
  Administered 2023-09-29: 4 mL via INTRAOCULAR

## 2023-09-29 MED ORDER — MOXIFLOXACIN HCL 0.5 % OP SOLN
OPHTHALMIC | Status: DC | PRN
  Administered 2023-09-29: .2 mL via OPHTHALMIC

## 2023-09-29 MED ORDER — SIGHTPATH DOSE#1 NA HYALUR & NA CHOND-NA HYALUR IO KIT
PACK | INTRAOCULAR | Status: DC | PRN
Start: 2023-09-29 — End: 2023-09-29
  Administered 2023-09-29: 1 via OPHTHALMIC

## 2023-09-29 MED ORDER — FENTANYL CITRATE (PF) 100 MCG/2ML IJ SOLN
INTRAMUSCULAR | Status: DC | PRN
  Administered 2023-09-29: 50 ug via INTRAVENOUS

## 2023-09-29 MED ORDER — ARMC OPHTHALMIC DILATING DROPS
OPHTHALMIC | Status: AC
Start: 2023-09-29 — End: 2023-09-29
  Filled 2023-09-29: qty 0.5

## 2023-09-29 SURGICAL SUPPLY — 10 items
DISSECTOR HYDRO NUCLEUS 50X22 (MISCELLANEOUS) ×1 IMPLANT
DRSG TEGADERM 2-3/8X2-3/4 SM (GAUZE/BANDAGES/DRESSINGS) ×1 IMPLANT
FEE CATARACT SUITE SIGHTPATH (MISCELLANEOUS) ×1 IMPLANT
GLOVE BIOGEL PI IND STRL 8 (GLOVE) ×1 IMPLANT
GLOVE SURG LX STRL 7.5 STRW (GLOVE) ×1 IMPLANT
GLOVE SURG SYN 6.5 PF PI BL (GLOVE) ×1 IMPLANT
LENS IOL CLRN PAN PRO TRC 20.5 IMPLANT
NDL FILTER BLUNT 18X1 1/2 (NEEDLE) ×1 IMPLANT
NEEDLE FILTER BLUNT 18X1 1/2 (NEEDLE) ×1 IMPLANT
SYR 3ML LL SCALE MARK (SYRINGE) ×1 IMPLANT

## 2023-09-29 NOTE — Op Note (Signed)
 OPERATIVE NOTE  Troy Gardner [id number] 09/29/2023   PREOPERATIVE DIAGNOSIS: Nuclear sclerotic cataract right eye. H25.11   POSTOPERATIVE DIAGNOSIS: Nuclear sclerotic cataract right eye. H25.11   PROCEDURE:  Phacoemusification with Toric posterior chamber intraocular lens placement of the right eye  Ultrasound time: Procedure(s): PHACOEMULSIFICATION, CATARACT, WITH IOL INSERTION 16.97 01:29.4 (Right)  LENS:   Implant Name Type Inv. Item Serial No. Manufacturer Lot No. LRB No. Used Action  [employer] PanOptix Pro Toric IOL 20.5 Intraocular Lens  [id number] SIGHTPATH  Right 1 Implanted    [id number] Toric intraocular lens with 1.50 diopters of cylindrical power with axis orientation at 177 degrees.  SURGEON:  [name]. Enola, MD   ANESTHESIA:  Topical with tetracaine  drops, augmented with 1% preservative-free intracameral lidocaine .   COMPLICATIONS:  None.   DESCRIPTION OF PROCEDURE:  The patient was identified in the holding room and transported to the operating room and placed in the supine position under the operating microscope.  The right eye was identified as the operative eye, which was prepped and draped in the usual sterile ophthalmic fashion.   A 1 millimeter clear-corneal paracentesis was made superotemporally. Preservative-free 1% lidocaine  mixed with 1:1,000 bisulfite-free aqueous solution of epinephrine  was injected into the anterior chamber. The anterior chamber was then filled with Viscoat viscoelastic. A 2.4 millimeter keratome was used to make a clear-corneal incision inferotemporally. A curvilinear capsulorrhexis was made with a cystotome and capsulorrhexis forceps. Balanced salt  solution was used to hydrodissect and hydrodelineate the nucleus. Phacoemulsification was then used to remove the lens nucleus and epinucleus. The remaining cortex was then removed using the irrigation and aspiration handpiece. Provisc was then placed into the capsular bag to distend it for lens  placement. The Verion digital marker was used to align the implant at the intended axis.  A +20.50 D [id number] Toric lens was then injected into the capsular bag.  It was rotated clockwise until the axis marks on the lens were approximately 15 degrees in the counterclockwise direction to the intended alignment.  The viscoelastic was aspirated from the eye using the irrigation aspiration handpiece.  Then, a blunt chopper through the sideport incision was used to rotate the lens in a clockwise direction until the axis markings of the intraocular lens were lined up with the Verion alignment.  Balanced salt  solution was then used to hydrate the wounds.   The anterior chamber was inflated to a physiologic pressure with balanced salt  solution.  No wound leaks were noted. Moxifloxacin  was injected intracamerally.  Timolol  and Brimonidine  drops were applied to the eye.  The patient was taken to the recovery room in stable condition without complications of anesthesia or surgery.  [employer] 09/29/2023, 10:52 AM

## 2023-09-29 NOTE — Transfer of Care (Signed)
 Immediate Anesthesia Transfer of Care Note  Patient: Troy Gardner  Procedure(s) Performed: PHACOEMULSIFICATION, CATARACT, WITH IOL INSERTION 16.97 01:29.4 (Right: Eye)  Patient Location: PACU  Anesthesia Type: MAC  Level of Consciousness: awake, alert  and patient cooperative  Airway and Oxygen Therapy: Patient Spontanous Breathing and Patient connected to supplemental oxygen  Post-op Assessment: Post-op Vital signs reviewed, Patient's Cardiovascular Status Stable, Respiratory Function Stable, Patent Airway and No signs of Nausea or vomiting  Post-op Vital Signs: Reviewed and stable  Complications: No notable events documented.

## 2023-09-29 NOTE — Anesthesia Postprocedure Evaluation (Signed)
 Anesthesia Post Note  Patient: Troy Gardner  Procedure(s) Performed: PHACOEMULSIFICATION, CATARACT, WITH IOL INSERTION 16.97 01:29.4 (Right: Eye)  Patient location during evaluation: PACU Anesthesia Type: MAC Level of consciousness: awake and alert Pain management: pain level controlled Vital Signs Assessment: post-procedure vital signs reviewed and stable Respiratory status: spontaneous breathing, nonlabored ventilation, respiratory function stable and patient connected to nasal cannula oxygen Cardiovascular status: stable and blood pressure returned to baseline Postop Assessment: no apparent nausea or vomiting Anesthetic complications: no   No notable events documented.   Last Vitals:  Vitals:   09/29/23 1054 09/29/23 1058  BP: 117/71 121/66  Pulse: 60 (!) 58  Resp: 16 13  Temp: (!) 36.2 C (!) 36.2 C  SpO2: 98% 97%    Last Pain:  Vitals:   09/29/23 1058  TempSrc:   PainSc: 0-No pain                 Brannen Koppen C Syniah Berne

## 2023-09-29 NOTE — H&P (Signed)
 [facility]   Primary Care Physician:  [place] HERO, MD Ophthalmologist: Dr. Feliciano Ober  Pre-Procedure History & Physical: HPI:  Troy Gardner is a 83 y.o. male here for cataract surgery.   Past Medical History:  Diagnosis Date   Arthritis    Basal cell carcinoma 04/08/2021   right lat neck inferior post auricular, [facility]   Borderline hypertension    Exertional dyspnea    GERD (gastroesophageal reflux disease)    h/o no meds   History of methicillin resistant staphylococcus aureus (MRSA)    YEARS AGO   Hx of actinic keratosis    Parkinson's disease (HCC)    PT STATES HES NOT SURE IF HAS PARKINSON'S-HE TOLD HIS PCP HE HAS OCCASIONAL HAND TREMORS AND PER PT, PCP TOLD HIM HE HAD PARKINSON'S-NO MEDS    Past Surgical History:  Procedure Laterality Date   ANKLE SURGERY  2010   CATARACT EXTRACTION W/PHACO Left 09/15/2023   Procedure: PHACOEMULSIFICATION, CATARACT, WITH IOL INSERTION 15.01 01:18.6;  Surgeon: Ober Feliciano Hugger, MD;  Location: [facility];  Service: Ophthalmology;  Laterality: Left;   COLONOSCOPY     UMBILICAL HERNIA REPAIR N/A 06/20/2019   Procedure: HERNIA REPAIR UMBILICAL ADULT, Open;  Surgeon: Marolyn Nest, MD;  Location: [facility];  Service: General;  Laterality: N/A;    Prior to Admission medications   Medication Sig Start Date End Date Taking? Authorizing Provider  Cholecalciferol (D3 2000 PO) Take by mouth daily.    [provider]  HYDROcodone -acetaminophen  (NORCO/VICODIN) 5-325 MG tablet Take 1 tablet by mouth every 6 (six) hours as needed for moderate pain. Patient not taking: Reported on 07/10/2019 06/20/19   Marolyn Nest, MD  ibuprofen  (ADVIL ) 800 MG tablet Take 1 tablet (800 mg total) by mouth every 8 (eight) hours as needed. 06/20/19   Marolyn Nest, MD    Allergies as of 08/24/2023 - Review Complete 07/05/2023  Allergen Reaction Noted   Tape Rash 06/14/2019    Family History  Problem Relation Age of Onset    Skin cancer Father    Diabetes Mother    Glaucoma Mother    Congestive Heart Failure Mother     Social History   Socioeconomic History   Marital status: Married    Spouse name: Ruby   Number of children: Not on file   Years of education: Not on file   Highest education level: Not on file  Occupational History   Not on file  Tobacco Use   Smoking status: Never   Smokeless tobacco: Former    Types: Engineer, drilling   Vaping status: Never Used  Substance and Sexual Activity   Alcohol use: Never   Drug use: Never   Sexual activity: Not on file  Other Topics Concern   Not on file  Social History Narrative   Not on file   Social Drivers of Health   Financial Resource Strain: Not on file  Food Insecurity: Not on file  Transportation Needs: Not on file  Physical Activity: Not on file  Stress: Not on file  Social Connections: Not on file  Intimate Partner Violence: Not on file    Review of Systems: See HPI, otherwise negative ROS  Physical Exam: There were no vitals taken for this visit. General:   Alert, cooperative in NAD Head:  Normocephalic and atraumatic. Respiratory:  Normal work of breathing. Cardiovascular:  RRR  Impression/Plan: Troy Gardner is here for cataract surgery.  Risks, benefits, limitations, and alternatives regarding  cataract surgery have been reviewed with the patient.  Questions have been answered.  All parties agreeable.   Feliciano Bryan Ober, MD  09/29/2023, 7:04 AM

## 2023-10-07 DIAGNOSIS — M4306 Spondylolysis, lumbar region: Secondary | ICD-10-CM | POA: Diagnosis not present

## 2023-10-07 DIAGNOSIS — M5432 Sciatica, left side: Secondary | ICD-10-CM | POA: Diagnosis not present

## 2023-10-07 DIAGNOSIS — M9905 Segmental and somatic dysfunction of pelvic region: Secondary | ICD-10-CM | POA: Diagnosis not present

## 2023-10-07 DIAGNOSIS — M9903 Segmental and somatic dysfunction of lumbar region: Secondary | ICD-10-CM | POA: Diagnosis not present

## 2023-10-19 DIAGNOSIS — H2511 Age-related nuclear cataract, right eye: Secondary | ICD-10-CM | POA: Diagnosis not present

## 2023-12-05 ENCOUNTER — Ambulatory Visit: Admitting: Dermatology

## 2023-12-05 ENCOUNTER — Encounter: Payer: Self-pay | Admitting: Dermatology

## 2023-12-05 DIAGNOSIS — L209 Atopic dermatitis, unspecified: Secondary | ICD-10-CM

## 2023-12-05 DIAGNOSIS — R21 Rash and other nonspecific skin eruption: Secondary | ICD-10-CM

## 2023-12-05 DIAGNOSIS — B86 Scabies: Secondary | ICD-10-CM

## 2023-12-05 MED ORDER — IVERMECTIN 3 MG PO TABS: ORAL_TABLET | ORAL | 0 refills | Status: AC

## 2023-12-05 NOTE — Patient Instructions (Signed)

## 2023-12-05 NOTE — Progress Notes (Signed)
   Follow-Up Visit   Subjective  Troy Gardner is a 83 y.o. male who presents for the following: rash arms, legs, trunk, groin, ~56m, itchy, uses [facility] 0.1% cr prn, pt had Prednisone x 3 days, stopped itching for the 3 days but did not clear the rash, no new medications, has not been around anyone with itchy rash, pt has had 1 shingrix vaccine   The following portions of the chart were reviewed this encounter and updated as appropriate: medications, allergies, medical history  Review of Systems:  No other skin or systemic complaints except as noted in HPI or Assessment and Plan.  Objective  Well appearing patient in no apparent distress; mood and affect are within normal limits.   A focused examination was performed of the following areas: Arms, legs, trunk  Relevant exam findings are noted in the Assessment and Plan.    Assessment & Plan   RASH Arms, legs, trunk Exam: Scaly pink edematous papules and numerous excoriations on flanks, forearms, upper arms, thighs, lower legs, infrapannus, mons. Focal interdigital and medial wrist inflamed papules 40% BSA  Chronic and persistent condition with duration or expected duration over one year. Condition is bothersome/symptomatic for patient. Currently flared.  Ddx Most likely atopic dermatitis but interdigital and pubic area lesions raise concern for scabies  Atopic dermatitis (eczema) is a chronic, relapsing, pruritic condition that can significantly affect quality of life. It is often associated with allergic rhinitis and/or asthma and can require treatment with topical medications, phototherapy, or in severe cases biologic injectable medication (Dupixent; Adbry) or Oral JAK inhibitors.  Treatment Plan: Jointly decided on empiric treatment of scabies. If no improvement from Ivermectin will get labs and pending labs will start Cibinqo Discussed Dupixent and side effects, pt prefers oral treatment do to still working and getting to office  for injections Weight 09/29/23 116 kg patient reports this is close to current weight Start Ivermectin 3mg  8 po x 1 dose and repeat in 14 days. SE incl but not limited to headache GI upset edema   SCABIES   ATOPIC DERMATITIS, UNSPECIFIED TYPE    Return in about 3 weeks (around 12/26/2023) for f/u Atopic derm vs scabies.  I, Grayce Saunas, RMA, am acting as scribe for Boneta Sharps, MD .   Documentation: I have reviewed the above documentation for accuracy and completeness, and I agree with the above.  Boneta Sharps, MD

## 2024-01-03 ENCOUNTER — Ambulatory Visit: Admitting: Dermatology

## 2024-01-03 ENCOUNTER — Encounter: Payer: Self-pay | Admitting: Dermatology

## 2024-01-03 DIAGNOSIS — L209 Atopic dermatitis, unspecified: Secondary | ICD-10-CM | POA: Diagnosis not present

## 2024-01-03 MED ORDER — CIBINQO 100 MG PO TABS: ORAL_TABLET | ORAL | 2 refills | Status: AC

## 2024-01-03 NOTE — Patient Instructions (Signed)

## 2024-01-03 NOTE — Progress Notes (Signed)
   Follow-Up Visit   Subjective  Troy Gardner is a 83 y.o. male who presents for the following: Patient states he has noticed new places, reports some places heal and new ones come up. Patient does note some improvement on ivermectin.   The following portions of the chart were reviewed this encounter and updated as appropriate: medications, allergies, medical history  Review of Systems:  No other skin or systemic complaints except as noted in HPI or Assessment and Plan.  Objective  Well appearing patient in no apparent distress; mood and affect are within normal limits.  A focused examination was performed of the following areas: Arms, legs, trunk, neck  Relevant exam findings are noted in the Assessment and Plan.    Assessment & Plan   RASH, improved after ivermectin but still flaring Arms, legs, trunk Exam: Scaly pink edematous papules and numerous excoriations on flanks, Right > than left chest, arms 20% BSA  Chronic and persistent condition with duration or expected duration over one year. Condition is bothersome/symptomatic for patient. Currently flared.  Ddx Post scabetic dermatitis vs atopic dermatitis  Atopic dermatitis (eczema) is a chronic, relapsing, pruritic condition that can significantly affect quality of life. It is often associated with allergic rhinitis and/or asthma and can require treatment with topical medications, phototherapy, or in severe cases biologic injectable medication (Dupixent; Adbry) or Oral JAK inhibitors.  Treatment Plan: Labs ordered today and pending labs start Cibinqo. Lab requisition given to patient. Wait for us  to call and say results are appropriate before starting cibinqo  - Start Abrocitinib (Cibinqo) 100mg  once a day. Patient given x2 bottle samples.  - Indications: moderate to severe atopic dermatitis > 22 yo  - Discussed systemic JAK inhibitors and atopic dermatitis. Typically patients have dramatic improvement in itch in several  days and eczema improves quickly over about a month. SER including ACNE (typically appears about 1 month into course then gradually improves), nausea, nasopharyngitis, HSV/VZ infection, transaminitis, hypertriglyceridemia, thromboembolic events. - Requires regular labs.  - Baseline labs: CBCd, CMP, lipid panel, Hep panel, quant gold, HIV.  - Condition requires long term medication management.  Patient is using long term (months to years) prescription medication  to control their dermatologic condition.  These medications require periodic monitoring to evaluate for efficacy and side effects and may require periodic laboratory monitoring.     ATOPIC DERMATITIS, UNSPECIFIED TYPE   Related Procedures Lipid Panel QuantiFERON-TB Gold Plus HIV Antibody (routine testing w rflx) Hepatitis C antibody Hepatitis B core antibody, total Hepatitis B surface antigen Hepatitis B surface antibody,quantitative Hepatitis B surface antibody,qualitative CBC with Differential/Platelet Comprehensive metabolic panel Related Medications Abrocitinib (CIBINQO) 100 MG TABS Take once po daily  Return in about 1 month (around 02/02/2024) for w/ Dr. Claudene.  I, Jacquelynn V. Wilfred, CMA, am acting as scribe for Boneta Claudene, MD.  Documentation: I have reviewed the above documentation for accuracy and completeness, and I agree with the above.  Boneta Claudene, MD

## 2024-01-06 LAB — QUANTIFERON-TB GOLD PLUS
QuantiFERON Mitogen Value: >10 [IU]/mL
QuantiFERON Nil Value: 0.02 [IU]/mL
QuantiFERON TB1 Ag Value: 0.07 [IU]/mL
QuantiFERON TB2 Ag Value: 0.06 [IU]/mL
QuantiFERON-TB Gold Plus: NEGATIVE

## 2024-01-06 LAB — CBC WITH DIFFERENTIAL/PLATELET
Basophils Absolute: 0.1 x10E3/uL (ref 0.0–0.2)
Basos: 1 %
EOS (ABSOLUTE): 0.3 x10E3/uL (ref 0.0–0.4)
Eos: 6 %
Hematocrit: 45.9 % (ref 37.5–51.0)
Hemoglobin: 15.3 g/dL (ref 13.0–17.7)
Immature Grans (Abs): 0 x10E3/uL (ref 0.0–0.1)
Immature Granulocytes: 0 %
Lymphocytes Absolute: 1.3 x10E3/uL (ref 0.7–3.1)
Lymphs: 24 %
MCH: 30.4 pg (ref 26.6–33.0)
MCHC: 33.3 g/dL (ref 31.5–35.7)
MCV: 91 fL (ref 79–97)
Monocytes Absolute: 0.5 x10E3/uL (ref 0.1–0.9)
Monocytes: 9 %
Neutrophils Absolute: 3.2 x10E3/uL (ref 1.4–7.0)
Neutrophils: 60 %
Platelets: 200 x10E3/uL (ref 150–450)
RBC: 5.03 x10E6/uL (ref 4.14–5.80)
RDW: 13.2 % (ref 11.6–15.4)
WBC: 5.3 x10E3/uL (ref 3.4–10.8)

## 2024-01-06 LAB — LIPID PANEL
Chol/HDL Ratio: 5.1 ratio — ABNORMAL HIGH (ref 0.0–5.0)
Cholesterol, Total: 173 mg/dL (ref 100–199)
HDL: 34 mg/dL — ABNORMAL LOW (ref >39)
LDL Chol Calc (NIH): 114 mg/dL — ABNORMAL HIGH (ref 0–99)
Triglycerides: 140 mg/dL (ref 0–149)
VLDL Cholesterol Cal: 25 mg/dL (ref 5–40)

## 2024-01-06 LAB — COMPREHENSIVE METABOLIC PANEL WITH GFR
ALT: 18 IU/L (ref 0–44)
AST: 13 IU/L (ref 0–40)
Albumin: 4.3 g/dL (ref 3.7–4.7)
Alkaline Phosphatase: 59 IU/L (ref 48–129)
BUN/Creatinine Ratio: 16 (ref 10–24)
BUN: 16 mg/dL (ref 8–27)
Bilirubin Total: 0.3 mg/dL (ref 0.0–1.2)
CO2: 25 mmol/L (ref 20–29)
Calcium: 9.2 mg/dL (ref 8.6–10.2)
Chloride: 104 mmol/L (ref 96–106)
Creatinine, Ser: 1.01 mg/dL (ref 0.76–1.27)
Globulin, Total: 2.2 g/dL (ref 1.5–4.5)
Glucose: 96 mg/dL (ref 70–99)
Potassium: 4.5 mmol/L (ref 3.5–5.2)
Sodium: 141 mmol/L (ref 134–144)
Total Protein: 6.5 g/dL (ref 6.0–8.5)
eGFR: 75 mL/min/1.73 (ref >59)

## 2024-01-06 LAB — HEPATITIS B SURFACE ANTIBODY, QUANTITATIVE: Hepatitis B Surf Ab Quant: <3.5 m[IU]/mL — ABNORMAL LOW

## 2024-01-06 LAB — HEPATITIS B CORE ANTIBODY, TOTAL: Hep B Core Total Ab: NEGATIVE

## 2024-01-06 LAB — HEPATITIS B SURFACE ANTIGEN: Hepatitis B Surface Ag: NEGATIVE

## 2024-01-06 LAB — HEPATITIS B SURFACE ANTIBODY,QUALITATIVE

## 2024-01-06 LAB — HEPATITIS C ANTIBODY: Hep C Virus Ab: NONREACTIVE

## 2024-01-06 LAB — HIV ANTIBODY (ROUTINE TESTING W REFLEX): HIV Screen 4th Generation wRfx: NONREACTIVE

## 2024-01-08 ENCOUNTER — Ambulatory Visit: Payer: Self-pay | Admitting: Dermatology

## 2024-01-09 NOTE — Telephone Encounter (Signed)
-----   Message from [place] sent at 01/08/2024  9:57 PM EST ----- Please call patient's mobile number to share blood tests were normal (normal blood counts, liver enzymes, kidney function, no hepatitis B or C, no HIV, no tuberculosis, acceptable lipids) and cibinqo  can be started. Thank you ----- Message ----- From: Interface, [facility] Lab Results In Sent: 01/06/2024   7:36 AM EST To: Boneta Sharps, MD

## 2024-01-09 NOTE — Telephone Encounter (Signed)
 Patient advised blood work came back WNL and can start samples of cibinqo. Lonell RAMAN., RMA

## 2024-02-06 ENCOUNTER — Ambulatory Visit: Admitting: Dermatology

## 2024-02-27 ENCOUNTER — Telehealth: Payer: Self-pay

## 2024-02-27 NOTE — Telephone Encounter (Signed)
 Opened in error . disregard

## 2024-07-10 ENCOUNTER — Ambulatory Visit: Admitting: Dermatology

## 2024-07-18 ENCOUNTER — Ambulatory Visit: Admitting: Dermatology
